# Patient Record
Sex: Female | Born: 1970 | Race: White | Hispanic: No | Marital: Married | Smoking: Never smoker
Health system: Southern US, Community
[De-identification: ages and names within clinical notes are randomized; demographics above are authoritative.]

## PROBLEM LIST (undated history)

## (undated) DIAGNOSIS — I499 Cardiac arrhythmia, unspecified: Secondary | ICD-10-CM

## (undated) DIAGNOSIS — R0683 Snoring: Secondary | ICD-10-CM

## (undated) DIAGNOSIS — I119 Hypertensive heart disease without heart failure: Secondary | ICD-10-CM

## (undated) DIAGNOSIS — R002 Palpitations: Secondary | ICD-10-CM

## (undated) HISTORY — DX: Cardiac arrhythmia, unspecified: I49.9

## (undated) HISTORY — DX: Snoring: R06.83

## (undated) HISTORY — DX: Palpitations: R00.2

## (undated) HISTORY — DX: Hypertensive heart disease without heart failure: I11.9

---

## 1998-08-29 ENCOUNTER — Inpatient Hospital Stay (HOSPITAL_COMMUNITY): Admission: RE | Admit: 1998-08-29 | Discharge: 1998-08-29 | Payer: Self-pay | Admitting: Obstetrics and Gynecology

## 1998-08-31 ENCOUNTER — Ambulatory Visit (HOSPITAL_COMMUNITY): Admission: RE | Admit: 1998-08-31 | Discharge: 1998-08-31 | Payer: Self-pay | Admitting: Obstetrics and Gynecology

## 1998-09-08 ENCOUNTER — Ambulatory Visit (HOSPITAL_COMMUNITY): Admission: RE | Admit: 1998-09-08 | Discharge: 1998-09-08 | Payer: Self-pay | Admitting: Obstetrics and Gynecology

## 1998-11-11 ENCOUNTER — Other Ambulatory Visit: Admission: RE | Admit: 1998-11-11 | Discharge: 1998-11-11 | Payer: Self-pay | Admitting: Obstetrics and Gynecology

## 1999-03-14 ENCOUNTER — Other Ambulatory Visit: Admission: RE | Admit: 1999-03-14 | Discharge: 1999-03-14 | Payer: Self-pay | Admitting: Obstetrics and Gynecology

## 1999-09-29 ENCOUNTER — Inpatient Hospital Stay (HOSPITAL_COMMUNITY): Admission: AD | Admit: 1999-09-29 | Discharge: 1999-09-29 | Payer: Self-pay | Admitting: Obstetrics & Gynecology

## 1999-10-06 ENCOUNTER — Inpatient Hospital Stay (HOSPITAL_COMMUNITY): Admission: AD | Admit: 1999-10-06 | Discharge: 1999-10-09 | Payer: Self-pay | Admitting: *Deleted

## 2000-07-16 ENCOUNTER — Other Ambulatory Visit: Admission: RE | Admit: 2000-07-16 | Discharge: 2000-07-16 | Payer: Self-pay | Admitting: Obstetrics and Gynecology

## 2001-11-10 ENCOUNTER — Other Ambulatory Visit: Admission: RE | Admit: 2001-11-10 | Discharge: 2001-11-10 | Payer: Self-pay | Admitting: Obstetrics and Gynecology

## 2002-06-19 ENCOUNTER — Ambulatory Visit (HOSPITAL_COMMUNITY): Admission: RE | Admit: 2002-06-19 | Discharge: 2002-06-19 | Payer: Self-pay | Admitting: Obstetrics and Gynecology

## 2002-09-02 ENCOUNTER — Inpatient Hospital Stay (HOSPITAL_COMMUNITY): Admission: AD | Admit: 2002-09-02 | Discharge: 2002-09-06 | Payer: Self-pay | Admitting: Obstetrics and Gynecology

## 2002-10-01 ENCOUNTER — Other Ambulatory Visit: Admission: RE | Admit: 2002-10-01 | Discharge: 2002-10-01 | Payer: Self-pay | Admitting: Obstetrics and Gynecology

## 2003-10-14 ENCOUNTER — Other Ambulatory Visit: Admission: RE | Admit: 2003-10-14 | Discharge: 2003-10-14 | Payer: Self-pay | Admitting: Obstetrics and Gynecology

## 2004-04-29 ENCOUNTER — Emergency Department (HOSPITAL_COMMUNITY): Admission: EM | Admit: 2004-04-29 | Discharge: 2004-04-29 | Payer: Self-pay | Admitting: Emergency Medicine

## 2012-01-11 ENCOUNTER — Ambulatory Visit: Admit: 2012-01-11 | Payer: Self-pay | Admitting: Obstetrics and Gynecology

## 2012-01-11 SURGERY — DILATATION & CURETTAGE/HYSTEROSCOPY WITH NOVASURE ABLATION
Anesthesia: Choice

## 2012-02-08 ENCOUNTER — Ambulatory Visit (INDEPENDENT_AMBULATORY_CARE_PROVIDER_SITE_OTHER): Payer: BC Managed Care – PPO | Admitting: Internal Medicine

## 2012-02-08 VITALS — BP 161/98 | HR 78 | Temp 97.9°F | Resp 16 | Ht 63.0 in | Wt 124.2 lb

## 2012-02-08 DIAGNOSIS — D649 Anemia, unspecified: Secondary | ICD-10-CM

## 2012-02-08 DIAGNOSIS — I10 Essential (primary) hypertension: Secondary | ICD-10-CM

## 2012-02-08 DIAGNOSIS — R51 Headache: Secondary | ICD-10-CM

## 2012-02-08 LAB — POCT URINALYSIS DIPSTICK
Bilirubin, UA: NEGATIVE
Blood, UA: NEGATIVE
Glucose, UA: NEGATIVE
Ketones, UA: NEGATIVE
Leukocytes, UA: NEGATIVE
Nitrite, UA: NEGATIVE
Protein, UA: NEGATIVE
Spec Grav, UA: 1.015
Urobilinogen, UA: 0.2
pH, UA: 7

## 2012-02-08 LAB — POCT CBC
Granulocyte percent: 64.4 %G (ref 37–80)
HCT, POC: 36.8 % — AB (ref 37.7–47.9)
Hemoglobin: 11.1 g/dL — AB (ref 12.2–16.2)
Lymph, poc: 1.4 (ref 0.6–3.4)
MCH, POC: 23.1 pg — AB (ref 27–31.2)
MCHC: 30.2 g/dL — AB (ref 31.8–35.4)
MCV: 76.6 fL — AB (ref 80–97)
MID (cbc): 0.4 (ref 0–0.9)
MPV: 10.6 fL (ref 0–99.8)
POC Granulocyte: 3.3 (ref 2–6.9)
POC LYMPH PERCENT: 27.9 %L (ref 10–50)
POC MID %: 7.7 %M (ref 0–12)
Platelet Count, POC: 283 10*3/uL (ref 142–424)
RBC: 4.8 M/uL (ref 4.04–5.48)
RDW, POC: 17.9 %
WBC: 5.1 10*3/uL (ref 4.6–10.2)

## 2012-02-08 LAB — POCT UA - MICROSCOPIC ONLY
Casts, Ur, LPF, POC: NEGATIVE
Crystals, Ur, HPF, POC: NEGATIVE
Mucus, UA: NEGATIVE
Yeast, UA: NEGATIVE

## 2012-02-08 MED ORDER — HYDROCHLOROTHIAZIDE 25 MG PO TABS: 25.0000 mg | ORAL_TABLET | Freq: Every day | ORAL | Status: DC

## 2012-02-08 NOTE — Progress Notes (Signed)
Subjective:    Patient ID: Jacqueline Roberts, female    DOB: 1971/07/30, 41 y.o.   MRN: [id number]  HPIheadache Onset several days ago  Throbbing right temple sometimes feels some numbness right side of the head no presently.  No radiation  Gaylyn Rong is moderate in severity under a lot of stress at work and is moving On and off for several days   has a hx of htn Was on a ace inhibitor caused cough and she stopped it 3 years ago Has a family history of htn    Review of Systems  Constitutional: Negative.   Eyes: Negative.   Respiratory: Negative.   Cardiovascular: Negative.   Gastrointestinal: Negative.   Genitourinary: Negative for vaginal pain.  Musculoskeletal: Negative.   Skin: Negative.   Neurological: Positive for headaches.  Hematological: Negative.   Psychiatric/Behavioral: Negative.   All other systems reviewed and are negative.       Objective:   Physical Exam  Nursing note and vitals reviewed. Constitutional: She is oriented to person, place, and time. She appears well-developed and well-nourished.  HENT:  Head: Normocephalic and atraumatic.  Left Ear: External ear normal.  Nose: Nose normal.  Mouth/Throat: Oropharynx is clear and moist.  Eyes: Conjunctivae and EOM are normal. Pupils are equal, round, and reactive to light.  Neck: Normal range of motion. Neck supple.  Cardiovascular: Normal rate, regular rhythm and normal heart sounds.   Pulmonary/Chest: Effort normal and breath sounds normal.  Abdominal: Soft.  Musculoskeletal: Normal range of motion.  Neurological: She is alert and oriented to person, place, and time. She has normal reflexes.  Skin: Skin is warm and dry.  Psychiatric: She has a normal mood and affect. Her behavior is normal. Judgment and thought content normal.      Results for orders placed in visit on 02/08/12  POCT CBC      Component Value Range   WBC 5.1  4.6 - 10.2 (K/uL)   Lymph, poc 1.4  0.6 - 3.4    POC LYMPH PERCENT 27.9  10 - 50  (%L)   MID (cbc) 0.4  0 - 0.9    POC MID % 7.7  0 - 12 (%M)   POC Granulocyte 3.3  2 - 6.9    Granulocyte percent 64.4  37 - 80 (%G)   RBC 4.80  4.04 - 5.48 (M/uL)   Hemoglobin 11.1 (*) 12.2 - 16.2 (g/dL)   HCT, POC 40.9 (*) 81.1 - 47.9 (%)   MCV 76.6 (*) 80 - 97 (fL)   MCH, POC 23.1 (*) 27 - 31.2 (pg)   MCHC 30.2 (*) 31.8 - 35.4 (g/dL)   RDW, POC 91.4     Platelet Count, POC 283  142 - 424 (K/uL)   MPV 10.6  0 - 99.8 (fL)  POCT UA - MICROSCOPIC ONLY      Component Value Range   WBC, Ur, HPF, POC 3-4     RBC, urine, microscopic 0-3     Bacteria, U Microscopic 2+     Mucus, UA neg     Epithelial cells, urine per micros 4-5     Crystals, Ur, HPF, POC neg     Casts, Ur, LPF, POC neg     Yeast, UA neg    POCT URINALYSIS DIPSTICK      Component Value Range   Color, UA yellow     Clarity, UA clear     Glucose, UA neg     Bilirubin, UA neg  Ketones, UA neg     Spec Grav, UA 1.015     Blood, UA neg     pH, UA 7.0     Protein, UA neg     Urobilinogen, UA 0.2     Nitrite, UA neg     Leukocytes, UA Negative       anemia mild pt is still menstruating. Also has some bacteria in urine but no leuks or nitirtes. Will increase hydration Assessment & Plan:  Has a hx of htn noncompliant with med's for the past several years. Has a family hx of htn and is under a lot of stress. Rechecked her bp nad it is elevated. bp is 160 /98.

## 2012-02-08 NOTE — Patient Instructions (Signed)
Take the bp medication daily. Control the salt in your diet. Exercise daily. follow up as directed.

## 2012-02-09 LAB — BASIC METABOLIC PANEL
BUN: 19 mg/dL (ref 6–23)
CO2: 26 mEq/L (ref 19–32)
Calcium: 9.2 mg/dL (ref 8.4–10.5)
Chloride: 104 mEq/L (ref 96–112)
Creat: 0.72 mg/dL (ref 0.50–1.10)
Glucose, Bld: 80 mg/dL (ref 70–99)
Potassium: 4.4 mEq/L (ref 3.5–5.3)
Sodium: 139 mEq/L (ref 135–145)

## 2013-05-07 ENCOUNTER — Other Ambulatory Visit: Payer: Self-pay | Admitting: Obstetrics and Gynecology

## 2014-08-20 ENCOUNTER — Other Ambulatory Visit: Payer: Self-pay | Admitting: Obstetrics and Gynecology

## 2014-08-24 LAB — CYTOLOGY - PAP

## 2014-12-23 ENCOUNTER — Other Ambulatory Visit: Payer: Self-pay | Admitting: Obstetrics and Gynecology

## 2015-10-18 ENCOUNTER — Ambulatory Visit (INDEPENDENT_AMBULATORY_CARE_PROVIDER_SITE_OTHER): Payer: BLUE CROSS/BLUE SHIELD

## 2015-10-18 ENCOUNTER — Ambulatory Visit (INDEPENDENT_AMBULATORY_CARE_PROVIDER_SITE_OTHER): Payer: BLUE CROSS/BLUE SHIELD | Admitting: Podiatry

## 2015-10-18 ENCOUNTER — Encounter: Payer: Self-pay | Admitting: Podiatry

## 2015-10-18 VITALS — BP 131/90 | HR 100 | Resp 16 | Ht 63.0 in | Wt 125.0 lb

## 2015-10-18 DIAGNOSIS — M898X9 Other specified disorders of bone, unspecified site: Secondary | ICD-10-CM | POA: Diagnosis not present

## 2015-10-18 DIAGNOSIS — M722 Plantar fascial fibromatosis: Secondary | ICD-10-CM | POA: Diagnosis not present

## 2015-10-18 NOTE — Progress Notes (Signed)
   Subjective:    Patient ID: Jacqueline Roberts, female    DOB: 03/30/1971, 45 y.o.   MRN: [id number]  HPI: She presents today with a chief complaint of a painful nodule to the plantar medial aspect of her left foot. She states that she noticed this nodule which initially was nontender November 2016. She states that over the past couple of months it has progressed in size and has become tender particularly in the mornings. She's done nothing for this.    Review of Systems  All other systems reviewed and are negative.      Objective:   Physical Exam: Vital signs are stable she is alert and oriented 3. No apparent distress. Pulses are strongly palpable. Neurologic sensorium is intact per Semmes-Weinstein monofilament. Deep tendon reflexes are intact bilateral and muscle strength +5 over 5 dorsiflexion and plantar flexors and inverters everters all intrinsic musculature is intact. Orthopedic evaluation of a straight solid joints distal to the ankle for range of motion without crepitus distal medial aspect of the medial band of plantar fascia does demonstrate small nonpulsatile firm nodule measuring less than 1-1/2 cm in total diameter. This nodule is tender on palpation. The contralateral foot does demonstrate a very early lesion which the patient did not even notice was present in a similar spot just proximal to the sesamoids. Radiographs do taken today do not demonstrate any type of osseus abnormalities in this area there is no calcification of the lesion. No underlying osseous pathology.        Assessment & Plan:  Plantar fibromatosis left greater than right.  Plan: I injected the lesion today with Kenalog and local anesthetic I will follow-up with her in 6 weeks. We discussed the pros and cons of this procedure etiology pathology conservative versus surgical therapies.

## 2015-11-29 ENCOUNTER — Encounter: Payer: Self-pay | Admitting: Podiatry

## 2015-11-29 ENCOUNTER — Ambulatory Visit (INDEPENDENT_AMBULATORY_CARE_PROVIDER_SITE_OTHER): Payer: BLUE CROSS/BLUE SHIELD | Admitting: Podiatry

## 2015-11-29 VITALS — BP 155/95 | HR 74 | Resp 16

## 2015-11-29 DIAGNOSIS — M722 Plantar fascial fibromatosis: Secondary | ICD-10-CM | POA: Diagnosis not present

## 2015-11-29 NOTE — Progress Notes (Signed)
She states that these nodules as started to go down. She states the one on the right is a little bit more tender than was the one on the left has decreased in size considerably.  Objective: Vital signs are stable she is alert and oriented 3 it appears that both of these lesions have decreased that we injected the left one only. There is no longer painful palpation or just touching the skin.  Assessment: Plantar fibromatosis left greater than right.  Plan: Follow-up with her in 1 month and consider reinjecting both of these.

## 2016-01-16 DIAGNOSIS — L821 Other seborrheic keratosis: Secondary | ICD-10-CM | POA: Diagnosis not present

## 2016-01-16 DIAGNOSIS — L739 Follicular disorder, unspecified: Secondary | ICD-10-CM | POA: Diagnosis not present

## 2016-01-16 DIAGNOSIS — L700 Acne vulgaris: Secondary | ICD-10-CM | POA: Diagnosis not present

## 2016-01-16 DIAGNOSIS — L720 Epidermal cyst: Secondary | ICD-10-CM | POA: Diagnosis not present

## 2016-03-13 DIAGNOSIS — R0683 Snoring: Secondary | ICD-10-CM | POA: Diagnosis not present

## 2016-03-13 DIAGNOSIS — J343 Hypertrophy of nasal turbinates: Secondary | ICD-10-CM | POA: Diagnosis not present

## 2016-03-13 DIAGNOSIS — H6521 Chronic serous otitis media, right ear: Secondary | ICD-10-CM | POA: Diagnosis not present

## 2016-03-13 DIAGNOSIS — J301 Allergic rhinitis due to pollen: Secondary | ICD-10-CM | POA: Diagnosis not present

## 2016-05-02 DIAGNOSIS — M25572 Pain in left ankle and joints of left foot: Secondary | ICD-10-CM | POA: Diagnosis not present

## 2016-10-12 DIAGNOSIS — H5213 Myopia, bilateral: Secondary | ICD-10-CM | POA: Diagnosis not present

## 2016-12-14 ENCOUNTER — Other Ambulatory Visit: Payer: Self-pay | Admitting: Obstetrics and Gynecology

## 2016-12-14 DIAGNOSIS — Z01419 Encounter for gynecological examination (general) (routine) without abnormal findings: Secondary | ICD-10-CM | POA: Diagnosis not present

## 2016-12-14 DIAGNOSIS — Z6823 Body mass index (BMI) 23.0-23.9, adult: Secondary | ICD-10-CM | POA: Diagnosis not present

## 2016-12-14 DIAGNOSIS — Z1231 Encounter for screening mammogram for malignant neoplasm of breast: Secondary | ICD-10-CM | POA: Diagnosis not present

## 2016-12-14 DIAGNOSIS — Z124 Encounter for screening for malignant neoplasm of cervix: Secondary | ICD-10-CM | POA: Diagnosis not present

## 2016-12-17 DIAGNOSIS — H168 Other keratitis: Secondary | ICD-10-CM | POA: Diagnosis not present

## 2016-12-17 LAB — CYTOLOGY - PAP

## 2017-01-15 DIAGNOSIS — L720 Epidermal cyst: Secondary | ICD-10-CM | POA: Diagnosis not present

## 2017-01-15 DIAGNOSIS — D2239 Melanocytic nevi of other parts of face: Secondary | ICD-10-CM | POA: Diagnosis not present

## 2017-03-12 ENCOUNTER — Ambulatory Visit
Admission: RE | Admit: 2017-03-12 | Discharge: 2017-03-12 | Disposition: A | Discharge status: Home / Self Care | Payer: BLUE CROSS/BLUE SHIELD | Source: Ambulatory Visit | Attending: Cardiology | Admitting: Cardiology

## 2017-03-12 ENCOUNTER — Other Ambulatory Visit: Payer: Self-pay | Admitting: Cardiology

## 2017-03-12 DIAGNOSIS — I10 Essential (primary) hypertension: Secondary | ICD-10-CM

## 2017-03-15 DIAGNOSIS — I119 Hypertensive heart disease without heart failure: Secondary | ICD-10-CM | POA: Diagnosis not present

## 2017-03-15 DIAGNOSIS — R0683 Snoring: Secondary | ICD-10-CM | POA: Diagnosis not present

## 2017-04-08 ENCOUNTER — Other Ambulatory Visit (HOSPITAL_COMMUNITY): Payer: Self-pay | Admitting: Obstetrics and Gynecology

## 2017-04-08 DIAGNOSIS — I119 Hypertensive heart disease without heart failure: Secondary | ICD-10-CM

## 2017-04-09 ENCOUNTER — Ambulatory Visit (HOSPITAL_COMMUNITY)
Admission: RE | Admit: 2017-04-09 | Discharge: 2017-04-09 | Disposition: A | Discharge status: Home / Self Care | Payer: BLUE CROSS/BLUE SHIELD | Source: Ambulatory Visit | Attending: Vascular Surgery | Admitting: Vascular Surgery

## 2017-04-09 DIAGNOSIS — I119 Hypertensive heart disease without heart failure: Secondary | ICD-10-CM | POA: Diagnosis not present

## 2017-04-10 DIAGNOSIS — Z6822 Body mass index (BMI) 22.0-22.9, adult: Secondary | ICD-10-CM | POA: Diagnosis not present

## 2017-04-10 DIAGNOSIS — R87610 Atypical squamous cells of undetermined significance on cytologic smear of cervix (ASC-US): Secondary | ICD-10-CM | POA: Diagnosis not present

## 2017-04-15 DIAGNOSIS — I119 Hypertensive heart disease without heart failure: Secondary | ICD-10-CM | POA: Diagnosis not present

## 2017-04-23 ENCOUNTER — Ambulatory Visit (HOSPITAL_BASED_OUTPATIENT_CLINIC_OR_DEPARTMENT_OTHER): Payer: BLUE CROSS/BLUE SHIELD | Attending: Cardiology | Admitting: Internal Medicine

## 2017-04-23 VITALS — Ht 63.0 in | Wt 128.0 lb

## 2017-04-23 DIAGNOSIS — R0683 Snoring: Secondary | ICD-10-CM

## 2017-04-23 DIAGNOSIS — G4733 Obstructive sleep apnea (adult) (pediatric): Secondary | ICD-10-CM | POA: Diagnosis not present

## 2017-04-27 DIAGNOSIS — R0683 Snoring: Secondary | ICD-10-CM

## 2017-04-27 NOTE — Procedures (Signed)
   Patient Name: Jacqueline Roberts, Jacqueline Roberts Date: 04/23/2017 Gender: Female D.O.B: 04/15/1971 Age (years): 46 Referring Provider: Ellwood Handler Height (inches): 63 Interpreting Physician: Jetty Duhamel MD, ABSM Weight (lbs): 128 RPSGT: Tennyson Sink BMI: 23 MRN: [id number] Neck Size: 12.00 CLINICAL INFORMATION Sleep Study Type: unattended HST  Indication for sleep study: Snoring  Epworth Sleepiness Score: 1  SLEEP STUDY TECHNIQUE A multi-channel overnight portable sleep study was performed. The channels recorded were: nasal airflow, thoracic respiratory movement, and oxygen saturation with a pulse oximetry. Snoring was also monitored.  MEDICATIONS Patient self administered medications include: none reported.  SLEEP ARCHITECTURE Patient was studied for 490.0 minutes. The sleep efficiency was 100.0 % and the patient was supine for 62.3%. The arousal index was 0.0 per hour.  RESPIRATORY PARAMETERS The overall AHI was 10.4 per hour, with a central apnea index of 0.0 per hour.  The oxygen nadir was 78% during sleep.  CARDIAC DATA Mean heart rate during sleep was 63.5 bpm.  IMPRESSIONS - Mild obstructive sleep apnea occurred during this study (AHI = 10.4/h). - No significant central sleep apnea occurred during this study (CAI = 0.0/h). - Severe oxygen desaturation was noted during this study (Min O2 = 78%, Mean 95%). - Patient snored.  DIAGNOSIS - Obstructive Sleep Apnea (327.23 [G47.33 ICD-10])  RECOMMENDATIONS  - Consider CPAP or a fitted oral appliance for scores in this range. Other options, beyond weight control and sleep off back, will be based on clinical judgment. - Be careful with alcohol, sedatives and other CNS depressants that may worsen sleep apnea and disrupt normal sleep architecture. - Sleep hygiene should be reviewed to assess factors that may improve sleep quality. - Weight management and regular exercise should be initiated or  continued.  [Electronically signed] 04/27/2017 10:00 AM  Jetty Duhamel MD, ABSM Diplomate, American Board of Sleep Medicine   NPI: [id number]  Waymon Budge Diplomate, American Board of Sleep Medicine  ELECTRONICALLY SIGNED ON:  04/27/2017, 9:56 AM [facility] PH: [phone]   FX: [phone] ACCREDITED BY THE AMERICAN ACADEMY OF SLEEP MEDICINE

## 2017-05-14 ENCOUNTER — Encounter: Payer: Self-pay | Admitting: Internal Medicine

## 2017-05-14 ENCOUNTER — Ambulatory Visit (INDEPENDENT_AMBULATORY_CARE_PROVIDER_SITE_OTHER): Payer: BLUE CROSS/BLUE SHIELD | Admitting: Internal Medicine

## 2017-05-14 VITALS — BP 122/78 | HR 57 | Ht 63.0 in | Wt 130.6 lb

## 2017-05-14 DIAGNOSIS — J3089 Other allergic rhinitis: Secondary | ICD-10-CM

## 2017-05-14 DIAGNOSIS — I10 Essential (primary) hypertension: Secondary | ICD-10-CM | POA: Insufficient documentation

## 2017-05-14 DIAGNOSIS — J302 Other seasonal allergic rhinitis: Secondary | ICD-10-CM | POA: Diagnosis not present

## 2017-05-14 DIAGNOSIS — G4733 Obstructive sleep apnea (adult) (pediatric): Secondary | ICD-10-CM

## 2017-05-14 DIAGNOSIS — R0683 Snoring: Secondary | ICD-10-CM

## 2017-05-14 HISTORY — DX: Other seasonal allergic rhinitis: J30.2

## 2017-05-14 HISTORY — DX: Other allergic rhinitis: J30.89

## 2017-05-14 HISTORY — DX: Obstructive sleep apnea (adult) (pediatric): G47.33

## 2017-05-14 HISTORY — DX: Essential (primary) hypertension: I10

## 2017-05-14 NOTE — Assessment & Plan Note (Signed)
Managed by cardiology 

## 2017-05-14 NOTE — Assessment & Plan Note (Signed)
Treatment for mild obstructive sleep apnea is targeting mainly disturbing symptoms. She doesn't recognize daytime sleepiness but might in retrospect if sleep apnea is controlled. We discussed benefit of keeping weight down and keeping seasonal nasal congestion controlled. We discussed treatment options and I think she would be a good candidate to consider a fitted oral appliance first. CPAP may be more intrusive. Plan-referral to orthodontist to consider oral appliance for treatment of OSA

## 2017-05-14 NOTE — Progress Notes (Signed)
05/14/17-46 year old female never smoker for sleep evaluation. Referred courtesy of Dr Donnie Aho; had sleep study and attached. No CPAP machine. Home sleep test 04/23/17 AHI 10.4/ hour, desaturation to 78% with mean saturation 95%, body weight 128 pounds She is followed by Dr. Donnie Aho for hypertension and reports mild seasonal allergic rhinitis with variable nasal stuffiness managed OTC. No ENT surgery, lung or heart problems, or thyroid disease identified. Sleep study was done because of loud chronic snoring for which her husband pokes her to roll over, with comorbidities as noted. She doesn't recognize much daytime sleepiness and endorses Epworth score 1/24. One cup of coffee. She had tried OTC nasal strips and nostril spacer without benefit. Bedtime between 10 and 11 PM, 15 minutes sleep latency, waking at least once and sometimes several times per night before up around 6:30 AM. Works regular hours in [employer]. One or 2 glasses of wine 5 days per week.  Prior to Admission medications   Medication Sig Start Date End Date Taking? Authorizing Provider  amLODipine (NORVASC) 5 MG tablet Take 1 tablet by mouth daily.   Yes [provider]  hydrochlorothiazide (MICROZIDE) 12.5 MG capsule Take 1 capsule by mouth daily. 04/08/17  Yes [provider]  metoprolol succinate (TOPROL-XL) 100 MG 24 hr tablet Take 1 tablet by mouth daily. 04/26/17  Yes [provider]  Multiple Vitamin (MULTIVITAMIN) tablet Take 1 tablet by mouth daily.   Yes [provider]   Past Medical History:  Diagnosis Date  . 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 Snoring    No past surgical history on file. Family History  Problem Relation Age of Onset  . Hypothyroidism Mother   . Hypertension Father   . Healthy Brother   . Healthy Brother    Social History   Social History  . Marital status: Married    Spouse name: N/A  . Number of children: N/A  . Years of education:  N/A   Occupational History  . customer servie     [employer]   Social History Main Topics  . Smoking status: Never Smoker  . Smokeless tobacco: Never Used  . Alcohol use 0.6 oz/week    1 Glasses of wine per week     Comment: Red  . Drug use: No  . Sexual activity: Not on file   Other Topics Concern  . Not on file   Social History Narrative  . No narrative on file   ROS-see HPI   + = Positive Constitutional:    weight loss, night sweats, fevers, chills, fatigue, lassitude. HEENT:    headaches, difficulty swallowing, tooth/dental problems, sore throat,       sneezing, itching, ear ache, +nasal congestion, post nasal drip, snoring CV:    chest pain, orthopnea, PND, swelling in lower extremities, anasarca,                                                        dizziness, palpitations Resp:   shortness of breath with exertion or at rest.                productive cough,   non-productive cough, coughing up of blood.              change in color of mucus.  wheezing.  Skin:   + rash or lesions. GI:   + heartburn, indigestion, abdominal pain, nausea, vomiting, diarrhea,                 change in bowel habits, loss of appetite GU: dysuria, change in color of urine, no urgency or frequency.   flank pain. MS:  + joint pain, stiffness, decreased range of motion, back pain. Neuro-     nothing unusual Psych:  change in mood or affect.  depression or anxiety.   memory loss.  OBJ- Physical Exam General- Alert, Oriented, Affect-appropriate, Distress- none acute, medium build Skin- rash-none, lesions- none, excoriation- none Lymphadenopathy- none Head- atraumatic            Eyes- Gross vision intact, PERRLA, conjunctivae and secretions clear            Ears- Hearing, canals-normal            Nose- + minimal turbinate edema, no-Septal dev, mucus, polyps, erosion, perforation             Throat- Mallampati III , mucosa clear , drainage- none, tonsils- atrophic Neck- flexible ,  trachea midline, no stridor , thyroid nl, carotid no bruit Chest - symmetrical excursion , unlabored           Heart/CV- RRR , no murmur , no gallop  , no rub, nl s1 s2                           - JVD- none , edema- none, stasis changes- none, varices- none           Lung- clear to P&A, wheeze- none, cough- none , dullness-none, rub- none           Chest wall-  Abd-  Br/ Gen/ Rectal- Not done, not indicated Extrem- cyanosis- none, clubbing, none, atrophy- none, strength- nl Neuro- grossly intact to observation

## 2017-05-14 NOTE — Patient Instructions (Signed)
Order- referral to orthodontist Dr Jacqueline GrimmerMark Roberts    Consider oral appliance for OSA and snoring  Please call if we can help

## 2017-05-14 NOTE — Assessment & Plan Note (Signed)
We discussed availability of OTC nonsedating antihistamines like Claritin, and nasal steroid like Flonase, seasonally if needed.

## 2017-05-31 DIAGNOSIS — G4733 Obstructive sleep apnea (adult) (pediatric): Secondary | ICD-10-CM | POA: Diagnosis not present

## 2017-05-31 DIAGNOSIS — I119 Hypertensive heart disease without heart failure: Secondary | ICD-10-CM | POA: Diagnosis not present

## 2017-06-05 DIAGNOSIS — D1801 Hemangioma of skin and subcutaneous tissue: Secondary | ICD-10-CM | POA: Diagnosis not present

## 2017-06-05 DIAGNOSIS — L720 Epidermal cyst: Secondary | ICD-10-CM | POA: Diagnosis not present

## 2017-06-24 DIAGNOSIS — I10 Essential (primary) hypertension: Secondary | ICD-10-CM | POA: Diagnosis not present

## 2017-07-12 DIAGNOSIS — G4733 Obstructive sleep apnea (adult) (pediatric): Secondary | ICD-10-CM | POA: Diagnosis not present

## 2017-07-12 DIAGNOSIS — I119 Hypertensive heart disease without heart failure: Secondary | ICD-10-CM | POA: Diagnosis not present

## 2017-07-31 DIAGNOSIS — J342 Deviated nasal septum: Secondary | ICD-10-CM | POA: Diagnosis not present

## 2017-07-31 DIAGNOSIS — J310 Chronic rhinitis: Secondary | ICD-10-CM | POA: Diagnosis not present

## 2017-07-31 DIAGNOSIS — J343 Hypertrophy of nasal turbinates: Secondary | ICD-10-CM | POA: Diagnosis not present

## 2017-07-31 DIAGNOSIS — G4733 Obstructive sleep apnea (adult) (pediatric): Secondary | ICD-10-CM | POA: Diagnosis not present

## 2017-08-29 ENCOUNTER — Institutional Professional Consult (permissible substitution): Payer: BLUE CROSS/BLUE SHIELD | Admitting: Internal Medicine

## 2017-09-12 DIAGNOSIS — L309 Dermatitis, unspecified: Secondary | ICD-10-CM | POA: Diagnosis not present

## 2017-10-18 DIAGNOSIS — I119 Hypertensive heart disease without heart failure: Secondary | ICD-10-CM | POA: Diagnosis not present

## 2017-10-18 DIAGNOSIS — G4733 Obstructive sleep apnea (adult) (pediatric): Secondary | ICD-10-CM | POA: Diagnosis not present

## 2017-11-13 ENCOUNTER — Encounter: Payer: Self-pay | Admitting: Podiatry

## 2017-12-03 ENCOUNTER — Ambulatory Visit: Payer: Self-pay | Admitting: General Surgery

## 2017-12-03 DIAGNOSIS — K625 Hemorrhage of anus and rectum: Secondary | ICD-10-CM | POA: Diagnosis not present

## 2017-12-03 NOTE — H&P (Signed)
History of Present Illness Romie Levee MD; 12/03/2017 9:32 AM) The patient is a 47 year old female who presents with a complaint of Rectal bleeding. 47 year old female who presents to the office with complaints of rectal bleeding and pain with bowel movements. She states that over the past 3-4 months she has had worsening pain which occurs 3-4 times a week with bowel movements. She notices blood on the toilet paper and sometimes in her stool on a daily basis. She occasionally has to strain to have a bowel movement. She does have regular function. She denies a family history of colon cancer. She has never had a colonoscopy.   Past Surgical History Doristine Devoid, CMA; 12/03/2017 9:28 AM) No pertinent past surgical history  Diagnostic Studies History Doristine Devoid, CMA; 12/03/2017 9:28 AM) Colonoscopy never Mammogram within last year Pap Smear 1-5 years ago  Allergies Doristine Devoid, CMA; 12/03/2017 9:29 AM) No Known Drug Allergies [12/03/2017]:  Medication History (Doristine Devoid, CMA; 12/03/2017 9:29 AM) AmLODIPine Besylate (5MG  Tablet, Oral) Active. HydroCHLOROthiazide (12.5MG  Capsule, Oral) Active. Metoprolol Succinate ER (100MG  Tablet ER 24HR, Oral) Active. Multivitamin Adult (Oral) Active. Medications Reconciled  Social History Doristine Devoid, CMA; 12/03/2017 9:28 AM) Alcohol use Occasional alcohol use. Caffeine use Coffee. No drug use Tobacco use Never smoker.  Family History Doristine Devoid, CMA; 12/03/2017 9:28 AM) Hypertension Father. Rectal Cancer Father.  Pregnancy / Birth History Doristine Devoid, CMA; 12/03/2017 9:28 AM) Age at menarche 14 years. Gravida 3 Maternal age 15-30 Para 2  Other Problems Doristine Devoid, CMA; 12/03/2017 9:28 AM) Gastroesophageal Reflux Disease Hemorrhoids High blood pressure Sleep Apnea     Review of Systems (Chemira Jones CMA; 12/03/2017 9:28 AM) General Not Present- Appetite Loss, Chills, Fatigue, Fever, Night Sweats,  Weight Gain and Weight Loss. Skin Not Present- Change in Wart/Mole, Dryness, Hives, Jaundice, New Lesions, Non-Healing Wounds, Rash and Ulcer. HEENT Present- Wears glasses/contact lenses. Not Present- Earache, Hearing Loss, Hoarseness, Nose Bleed, Oral Ulcers, Ringing in the Ears, Seasonal Allergies, Sinus Pain, Sore Throat, Visual Disturbances and Yellow Eyes. Respiratory Present- Snoring. Not Present- Bloody sputum, Chronic Cough, Difficulty Breathing and Wheezing. Breast Not Present- Breast Mass, Breast Pain, Nipple Discharge and Skin Changes. Cardiovascular Not Present- Chest Pain, Difficulty Breathing Lying Down, Leg Cramps, Palpitations, Rapid Heart Rate, Shortness of Breath and Swelling of Extremities. Gastrointestinal Present- Hemorrhoids. Not Present- Abdominal Pain, Bloating, Bloody Stool, Change in Bowel Habits, Chronic diarrhea, Constipation, Difficulty Swallowing, Excessive gas, Gets full quickly at meals, Indigestion, Nausea, Rectal Pain and Vomiting. Female Genitourinary Not Present- Frequency, Nocturia, Painful Urination, Pelvic Pain and Urgency. Musculoskeletal Not Present- Back Pain, Joint Pain, Joint Stiffness, Muscle Pain, Muscle Weakness and Swelling of Extremities. Neurological Not Present- Decreased Memory, Fainting, Headaches, Numbness, Seizures, Tingling, Tremor, Trouble walking and Weakness. Psychiatric Not Present- Anxiety, Bipolar, Change in Sleep Pattern, Depression, Fearful and Frequent crying. Endocrine Not Present- Cold Intolerance, Excessive Hunger, Hair Changes, Heat Intolerance, Hot flashes and New Diabetes. Hematology Not Present- Blood Thinners, Easy Bruising, Excessive bleeding, Gland problems, HIV and Persistent Infections.  Vitals (Chemira Jones CMA; 12/03/2017 9:28 AM) 12/03/2017 9:28 AM Weight: 136.8 lb Height: 63in Body Surface Area: 1.65 m Body Mass Index: 24.23 kg/m  Temp.: 97.31F(Oral)  Pulse: 86 (Regular)  BP: 124/82 (Sitting, Left Arm,  Standard)      Physical Exam Romie Levee MD; 12/03/2017 9:40 AM)  General Mental Status-Alert. General Appearance-Not in acute distress. Build & Nutrition-Well nourished. Posture-Normal posture. Gait-Normal.  Head and Neck Head-normocephalic, atraumatic with no lesions or palpable masses. Trachea-midline.  Chest and Lung Exam Chest and lung exam reveals -on auscultation, normal breath sounds, no adventitious sounds and normal vocal resonance.  Cardiovascular Cardiovascular examination reveals -normal heart sounds, regular rate and rhythm with no murmurs and no digital clubbing, cyanosis, edema, increased warmth or tenderness.  Abdomen Inspection Inspection of the abdomen reveals - No Hernias. Palpation/Percussion Palpation and Percussion of the abdomen reveal - Soft, Non Tender, No Rigidity (guarding), No hepatosplenomegaly and No Palpable abdominal masses.  Neurologic Neurologic evaluation reveals -alert and oriented x 3 with no impairment of recent or remote memory, normal attention span and ability to concentrate, normal sensation and normal coordination.  Musculoskeletal Normal Exam - Bilateral-Upper Extremity Strength Normal and Lower Extremity Strength Normal.   Results Romie Levee(Emelie Newsom MD; 12/03/2017 9:47 AM) Procedures  Name Value Date ANOSCOPY, DIAGNOSTIC ([id number]) [ Hemorrhoids ] Procedure Other: Procedure: Anoscopy....Marland Kitchen.Marland Kitchen.Surgeon: Maisie Fushomas....Marland Kitchen.Marland Kitchen.After the risks and benefits were explained, verbal consent was obtained for above procedure. A medical assistant chaperone was present thoroughout the entire procedure. ....Marland Kitchen.Marland Kitchen.Anesthesia: none....Marland Kitchen.Marland Kitchen.Diagnosis: rectal bleeding....Marland Kitchen.Marland Kitchen.Findings: grade 1 LL and RA hemorrhoids, grade 2 RP hemorrhoid with associated external component  Performed: 12/03/2017 9:45 AM    Assessment & Plan Romie Levee(Corene Resnick MD; 12/03/2017 9:45 AM)  RECTAL BLEEDING (K62.5) Impression: Residual female with rectal bleeding  and anal pain. This is most likely due to a right posterior hemorrhoid. Given her age and symptoms, I would like to get a colonoscopy to rule out colorectal cancer first. If this is negative, we will discuss possible hemorrhoidectomy. In the meantime I will give her a suppository and I have asked her to start a fiber supplement to help control her symptoms.

## 2017-12-06 DIAGNOSIS — D692 Other nonthrombocytopenic purpura: Secondary | ICD-10-CM | POA: Diagnosis not present

## 2017-12-06 DIAGNOSIS — B078 Other viral warts: Secondary | ICD-10-CM | POA: Diagnosis not present

## 2017-12-06 DIAGNOSIS — D2261 Melanocytic nevi of right upper limb, including shoulder: Secondary | ICD-10-CM | POA: Diagnosis not present

## 2017-12-06 DIAGNOSIS — D2262 Melanocytic nevi of left upper limb, including shoulder: Secondary | ICD-10-CM | POA: Diagnosis not present

## 2017-12-06 DIAGNOSIS — L570 Actinic keratosis: Secondary | ICD-10-CM | POA: Diagnosis not present

## 2017-12-06 DIAGNOSIS — D225 Melanocytic nevi of trunk: Secondary | ICD-10-CM | POA: Diagnosis not present

## 2018-01-08 ENCOUNTER — Ambulatory Visit (HOSPITAL_COMMUNITY)
Admission: RE | Admit: 2018-01-08 | Discharge: 2018-01-08 | Disposition: A | Discharge status: Home / Self Care | Payer: BLUE CROSS/BLUE SHIELD | Source: Ambulatory Visit | Attending: General Surgery | Admitting: General Surgery

## 2018-01-08 ENCOUNTER — Encounter (HOSPITAL_COMMUNITY)
Admission: RE | Disposition: A | Discharge status: Home / Self Care | Payer: Self-pay | Source: Ambulatory Visit | Attending: General Surgery

## 2018-01-08 ENCOUNTER — Encounter (HOSPITAL_COMMUNITY): Payer: Self-pay | Admitting: *Deleted

## 2018-01-08 DIAGNOSIS — K625 Hemorrhage of anus and rectum: Secondary | ICD-10-CM | POA: Insufficient documentation

## 2018-01-08 DIAGNOSIS — I10 Essential (primary) hypertension: Secondary | ICD-10-CM | POA: Insufficient documentation

## 2018-01-08 DIAGNOSIS — Z1211 Encounter for screening for malignant neoplasm of colon: Secondary | ICD-10-CM | POA: Diagnosis not present

## 2018-01-08 DIAGNOSIS — K219 Gastro-esophageal reflux disease without esophagitis: Secondary | ICD-10-CM | POA: Insufficient documentation

## 2018-01-08 DIAGNOSIS — Z79899 Other long term (current) drug therapy: Secondary | ICD-10-CM | POA: Insufficient documentation

## 2018-01-08 DIAGNOSIS — G4730 Sleep apnea, unspecified: Secondary | ICD-10-CM | POA: Insufficient documentation

## 2018-01-08 HISTORY — PX: COLONOSCOPY: SHX5424

## 2018-01-08 SURGERY — COLONOSCOPY
Anesthesia: Moderate Sedation

## 2018-01-08 MED ORDER — FENTANYL CITRATE (PF) 100 MCG/2ML IJ SOLN
INTRAMUSCULAR | Status: AC
  Filled 2018-01-08: qty 2

## 2018-01-08 MED ORDER — DIPHENHYDRAMINE HCL 50 MG/ML IJ SOLN
INTRAMUSCULAR | Status: AC
  Filled 2018-01-08: qty 1

## 2018-01-08 MED ORDER — FENTANYL CITRATE (PF) 100 MCG/2ML IJ SOLN
INTRAMUSCULAR | Status: DC | PRN
  Administered 2018-01-08 (×5): 25 ug via INTRAVENOUS

## 2018-01-08 MED ORDER — LACTATED RINGERS IV SOLN: INTRAVENOUS | Status: DC

## 2018-01-08 MED ORDER — MIDAZOLAM HCL 5 MG/ML IJ SOLN
INTRAMUSCULAR | Status: AC
  Filled 2018-01-08: qty 2

## 2018-01-08 MED ORDER — MIDAZOLAM HCL 10 MG/2ML IJ SOLN
INTRAMUSCULAR | Status: DC | PRN
  Administered 2018-01-08 (×2): 2 mg via INTRAVENOUS
  Administered 2018-01-08 (×2): 1 mg via INTRAVENOUS
  Administered 2018-01-08: 2 mg via INTRAVENOUS

## 2018-01-08 MED ORDER — SODIUM CHLORIDE 0.9 % IV SOLN
INTRAVENOUS | Status: AC | PRN
  Administered 2018-01-08: 500 mL via INTRAMUSCULAR

## 2018-01-08 NOTE — H&P (Signed)
History of Present Illness Romie Levee MD; 12/03/2017 9:32 AM) The patient is a 47 year old female who presents with a complaint of Rectal bleeding. 47 year old female who presents to the office with complaints of rectal bleeding and pain with bowel movements. She states that over the past 3-4 months she has had worsening pain which occurs 3-4 times a week with bowel movements. She notices blood on the toilet paper and sometimes in her stool on a daily basis. She occasionally has to strain to have a bowel movement. She does have regular function. She denies a family history of colon cancer. She has never had a colonoscopy.   Past Surgical History Doristine Devoid, CMA; 12/03/2017 9:28 AM) No pertinent past surgical history   Diagnostic Studies History Doristine Devoid, CMA; 12/03/2017 9:28 AM) Colonoscopy  never Mammogram  within last year Pap Smear  1-5 years ago  Allergies Doristine Devoid, CMA; 12/03/2017 9:29 AM) No Known Drug Allergies [12/03/2017]:  Medication History (Doristine Devoid, CMA; 12/03/2017 9:29 AM) AmLODIPine Besylate (  Tablet, Oral) Active. HydroCHLOROthiazide (12.5MG  Capsule, Oral) Active. Metoprolol Succinate ER (  Tablet ER 24HR, Oral) Active. Multivitamin Adult (Oral) Active. Medications Reconciled  Social History Doristine Devoid, CMA; 12/03/2017 9:28 AM) Alcohol use  Occasional alcohol use. Caffeine use  Coffee. No drug use  Tobacco use  Never smoker.  Family History Doristine Devoid, CMA; 12/03/2017 9:28 AM) Hypertension  Father. Rectal Cancer  Father.  Pregnancy / Birth History Doristine Devoid, CMA; 12/03/2017 9:28 AM) Age at menarche  14 years. Gravida  3 Maternal age  36-30 Para  2  Other Problems Doristine Devoid, CMA; 12/03/2017 9:28 AM) Gastroesophageal Reflux Disease  Hemorrhoids  High blood pressure  Sleep Apnea     Review of Systems  General Not Present- Appetite Loss, Chills, Fatigue, Fever, Night Sweats, Weight Gain and  Weight Loss. Skin Not Present- Change in Wart/Mole, Dryness, Hives, Jaundice, New Lesions, Non-Healing Wounds, Rash and Ulcer. HEENT Present- Wears glasses/contact lenses. Not Present- Earache, Hearing Loss, Hoarseness, Nose Bleed, Oral Ulcers, Ringing in the Ears, Seasonal Allergies, Sinus Pain, Sore Throat, Visual Disturbances and Yellow Eyes. Respiratory Present- Snoring. Not Present- Bloody sputum, Chronic Cough, Difficulty Breathing and Wheezing. Breast Not Present- Breast Mass, Breast Pain, Nipple Discharge and Skin Changes. Cardiovascular Not Present- Chest Pain, Difficulty Breathing Lying Down, Leg Cramps, Palpitations, Rapid Heart Rate, Shortness of Breath and Swelling of Extremities. Gastrointestinal Present- Hemorrhoids. Not Present- Abdominal Pain, Bloating, Bloody Stool, Change in Bowel Habits, Chronic diarrhea, Constipation, Difficulty Swallowing, Excessive gas, Gets full quickly at meals, Indigestion, Nausea, Rectal Pain and Vomiting. Female Genitourinary Not Present- Frequency, Nocturia, Painful Urination, Pelvic Pain and Urgency. Musculoskeletal Not Present- Back Pain, Joint Pain, Joint Stiffness, Muscle Pain, Muscle Weakness and Swelling of Extremities. Neurological Not Present- Decreased Memory, Fainting, Headaches, Numbness, Seizures, Tingling, Tremor, Trouble walking and Weakness. Psychiatric Not Present- Anxiety, Bipolar, Change in Sleep Pattern, Depression, Fearful and Frequent crying. Endocrine Not Present- Cold Intolerance, Excessive Hunger, Hair Changes, Heat Intolerance, Hot flashes and New Diabetes. Hematology Not Present- Blood Thinners, Easy Bruising, Excessive bleeding, Gland problems, HIV and Persistent Infections.  BP (!) 143/94   Pulse 86   Temp 98 F (36.7 C) (Oral)   Resp 12   Ht  (1.6 m)   Wt 59 kg (130 lb)   SpO2 100%   BMI 23.03 kg/m    Physical Exam  General Mental Status-Alert. General Appearance-Not in acute distress. Build &  Nutrition-Well nourished. Posture-Normal posture. Gait-Normal.  Head and Neck  Head-normocephalic, atraumatic with no lesions or palpable masses. Trachea-midline.  Chest and Lung Exam Chest and lung exam reveals -on auscultation, normal breath sounds, no adventitious sounds and normal vocal resonance.  Cardiovascular Cardiovascular examination reveals -normal heart sounds, regular rate and rhythm with no murmurs and no digital clubbing, cyanosis, edema, increased warmth or tenderness.  Abdomen Inspection Inspection of the abdomen reveals - No Hernias. Palpation/Percussion Palpation and Percussion of the abdomen reveal - Soft, Non Tender, No Rigidity (guarding), No hepatosplenomegaly and No Palpable abdominal masses.  Neurologic Neurologic evaluation reveals -alert and oriented x 3 with no impairment of recent or remote memory, normal attention span and ability to concentrate, normal sensation and normal coordination.  Musculoskeletal Normal Exam - Bilateral-Upper Extremity Strength Normal and Lower Extremity Strength Normal.   ANOSCOPY, DIAGNOSTIC (46600) [ Hemorrhoids ] Procedure Other: Procedure: Anoscopy....Marland KitchenMarland KitchenSurgeon: Maisie Fus....Marland KitchenMarland Kitch([id number] the risks and benefits were explained, verbal consent was obtained for above procedure. A medical assistant chaperone was present thoroughout the entire procedure. ....Marland KitchenMarland KitchenAnesthesia: none....Marland KitchenMarland KitchenDiagnosis: rectal bleeding....Marland KitchenMarland KitchenFindings: grade 1 LL and RA hemorrhoids, grade 2 RP hemorrhoid with associated external component  Performed: 12/03/2017 9:45 AM    Assessment & Plan RECTAL BLEEDING (K62.5) Impression: Residual female with rectal bleeding and anal pain. This is most likely due to a right posterior hemorrhoid. Given her age and symptoms, I would like to get a colonoscopy to rule out colorectal cancer first. We discussed the risks of procedure, which include bleeding, perforation and missed pathology.  All questions  were answered

## 2018-01-08 NOTE — Op Note (Signed)
[facility] Patient Name: Jacqueline Roberts Procedure Date: 01/08/2018 MRN: [id number] Attending MD: Romie Levee , MD Date of Birth: 1971/06/28 CSN: [id number] Age: 47 Admit Type: Outpatient Procedure:                Colonoscopy Indications:              Screening for colorectal malignant neoplasm Providers:                Romie Levee, MD, Dwain Sarna, RN, Madalyn Rob, Technician, Leroy Libman, CRNA Referring MD:              Medicines:                Fentanyl 125 micrograms IV, Midazolam 10 mg IV Complications:            No immediate complications. Estimated Blood Loss:     Estimated blood loss: none. Procedure:                Pre-Anesthesia Assessment:                           - Prior to the procedure, a History and Physical                            was performed, and patient medications and                            allergies were reviewed. The patient's tolerance of                            previous anesthesia was also reviewed. The risks                            and benefits of the procedure and the sedation                            options and risks were discussed with the patient.                            All questions were answered, and informed consent                            was obtained. Prior Anticoagulants: The patient has                            taken no previous anticoagulant or antiplatelet                            agents. ASA Grade Assessment: II - A patient with                            mild systemic disease. After reviewing the risks  and benefits, the patient was deemed in                            satisfactory condition to undergo the procedure.                           After obtaining informed consent, the colonoscope                            was passed under direct vision. Throughout the                            procedure, the patient's blood pressure,  pulse, and                            oxygen saturations were monitored continuously. The                            was introduced through the anus and advanced to the                            the cecum, identified by the ileocecal valve. The                            colonoscopy was somewhat difficult due to a                            redundant colon. Successful completion of the                            procedure was aided by increasing the dose of                            sedation medication, changing the patient to a                            supine position, using manual pressure and using                            scope torsion. The patient tolerated the procedure                            fairly well. The quality of the bowel preparation                            was excellent. The ileocecal valve and the rectum                            were photographed. Scope withdrawal time was 11                            minutes. Scope In: 11:53:23 AM Scope Out: 12:28:43 PM Scope Withdrawal Time: 0 hours 10 minutes 40 seconds  Total  Procedure Duration: 0 hours 35 minutes 20 seconds  Findings:      The perianal and digital rectal examinations were normal.      The entire examined colon appeared normal on direct and retroflexion       views. Impression:               - The entire examined colon is normal on direct and                            retroflexion views.                           - No specimens collected. Moderate Sedation:      Moderate (conscious) sedation was administered by the endoscopy nurse       and supervised by the endoscopist. The following parameters were       monitored: oxygen saturation, heart rate, blood pressure, and response       to care. Recommendation:           - Discharge patient to home (ambulatory).                           - High fiber diet today.                           - Continue present medications.                           -  Repeat colonoscopy in 10 years for screening                            purposes. Procedure Code(s):        --- Professional ---                           W0981, Colorectal cancer screening; colonoscopy on                            individual not meeting criteria for high risk Diagnosis Code(s):        --- Professional ---                           Z12.11, Encounter for screening for malignant                            neoplasm of colon CPT copyright 2017 American [employer]. All rights reserved. The codes documented in this report are preliminary and upon coder review may  be revised to meet current compliance requirements. Romie Levee, MD Romie Levee, MD 01/08/2018 12:38:15 PM This report has been signed electronically. Number of Addenda: 0

## 2018-01-08 NOTE — Discharge Instructions (Signed)
Post Colonoscopy Instructions ° °1. DIET: Follow a light bland diet the first 24 hours after arrival home, such as soup, liquids, crackers, etc.  Be sure to include lots of fluids daily.  Avoid fast food or heavy meals as your are more likely to get nauseated.   °2. You may have some mild rectal bleeding for the first few days after the procedure.  This should get less and less with time.  Resume any blood thinners 2 days after your procedure unless directed otherwise by your physician. °3. Take your usually prescribed home medications unless otherwise directed. °a. If you have any pain, it is helpful to get up and walk around, as it is usually from excess gas. °b. If this is not helpful, you can take an over-the-counter pain medication.  Choose one of the following that works best for you: °i. Naproxen (Aleve, etc)  Two 220mg tabs twice a day °ii. Ibuprofen (Advil, etc) Three 200mg tabs four times a day (every meal & bedtime) °iii. If you still have pain after using one of these, please call the office °4. It is normal to not have a bowel movement for 2-3 days after colonoscopy.   ° °5. ACTIVITIES as tolerated:   °6. You may resume regular (light) daily activities beginning the next day--such as daily self-care, walking, climbing stairs--gradually increasing activities as tolerated.  ° ° °WHEN TO CALL US [phone]: °1. Fever over 101.5 F (38.5 C)  °2. Severe abdominal or chest pain  °3. Large amount of rectal bleeding, passing multiple blood clots  °4. Dizziness or shortness of breath °5. Increasing nausea or vomiting ° ° The clinic staff is available to answer your questions during regular business hours (8:30am-5pm).  Please don’t hesitate to call and ask to speak to one of our nurses for clinical concerns.  ° If you have a medical emergency, go to the nearest emergency room or call 911. ° A surgeon from [facility] is always on call at the hospitals ° ° °[facility] °[address], [address], [place], [place]  [address] ? °MAIN: [phone] ? TOLL FREE: [phone] ?  °FAX [phone] °[email or web address] ° ° °

## 2018-01-09 ENCOUNTER — Encounter (HOSPITAL_COMMUNITY): Payer: Self-pay | Admitting: General Surgery

## 2018-02-27 IMAGING — CR DG CHEST 2V
2 series · 2 of 2 positions shown · non-contrast
Comparison: None.

CLINICAL DATA: Hypertension.

EXAM:
CHEST  2 VIEW

[w chest pa]
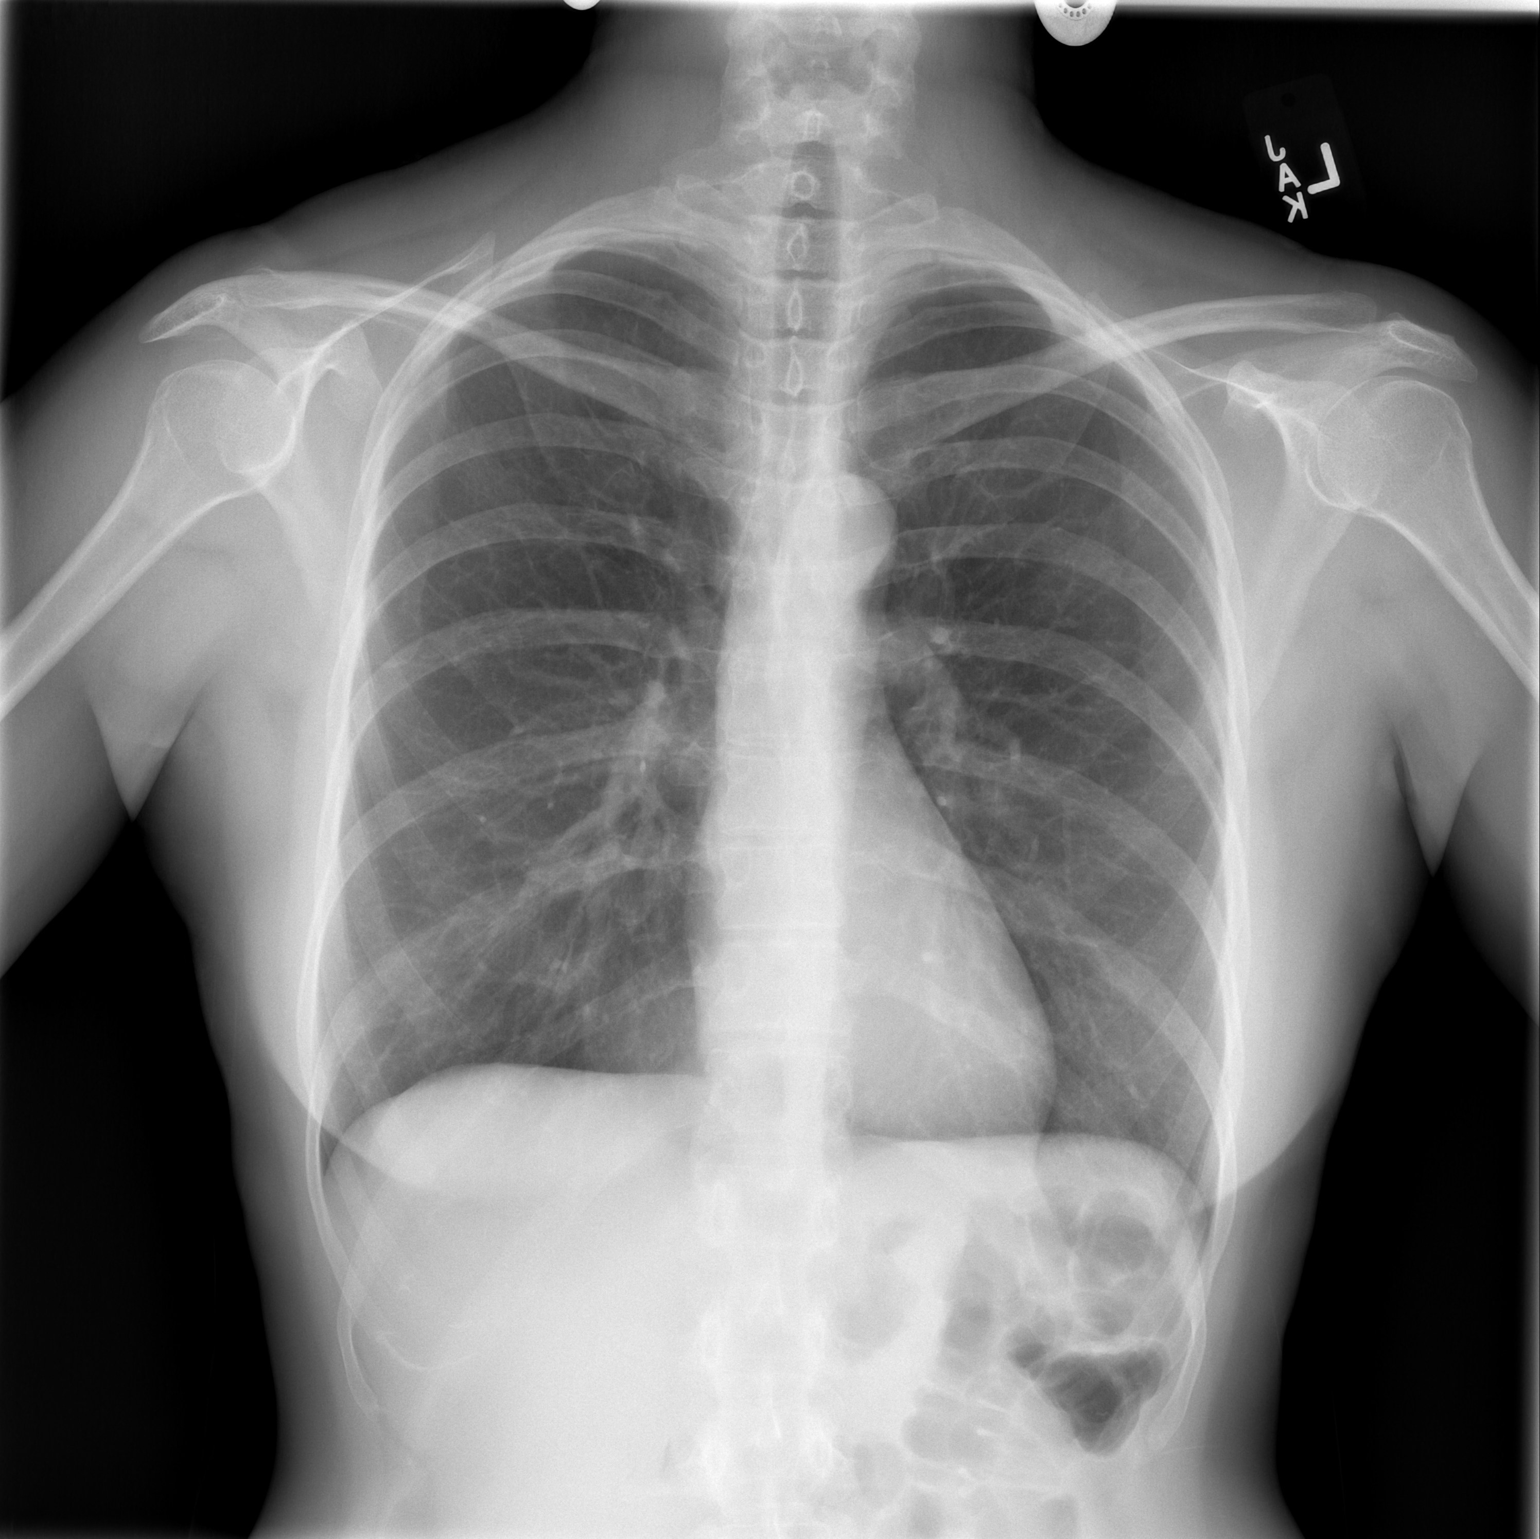

[w chest lat]
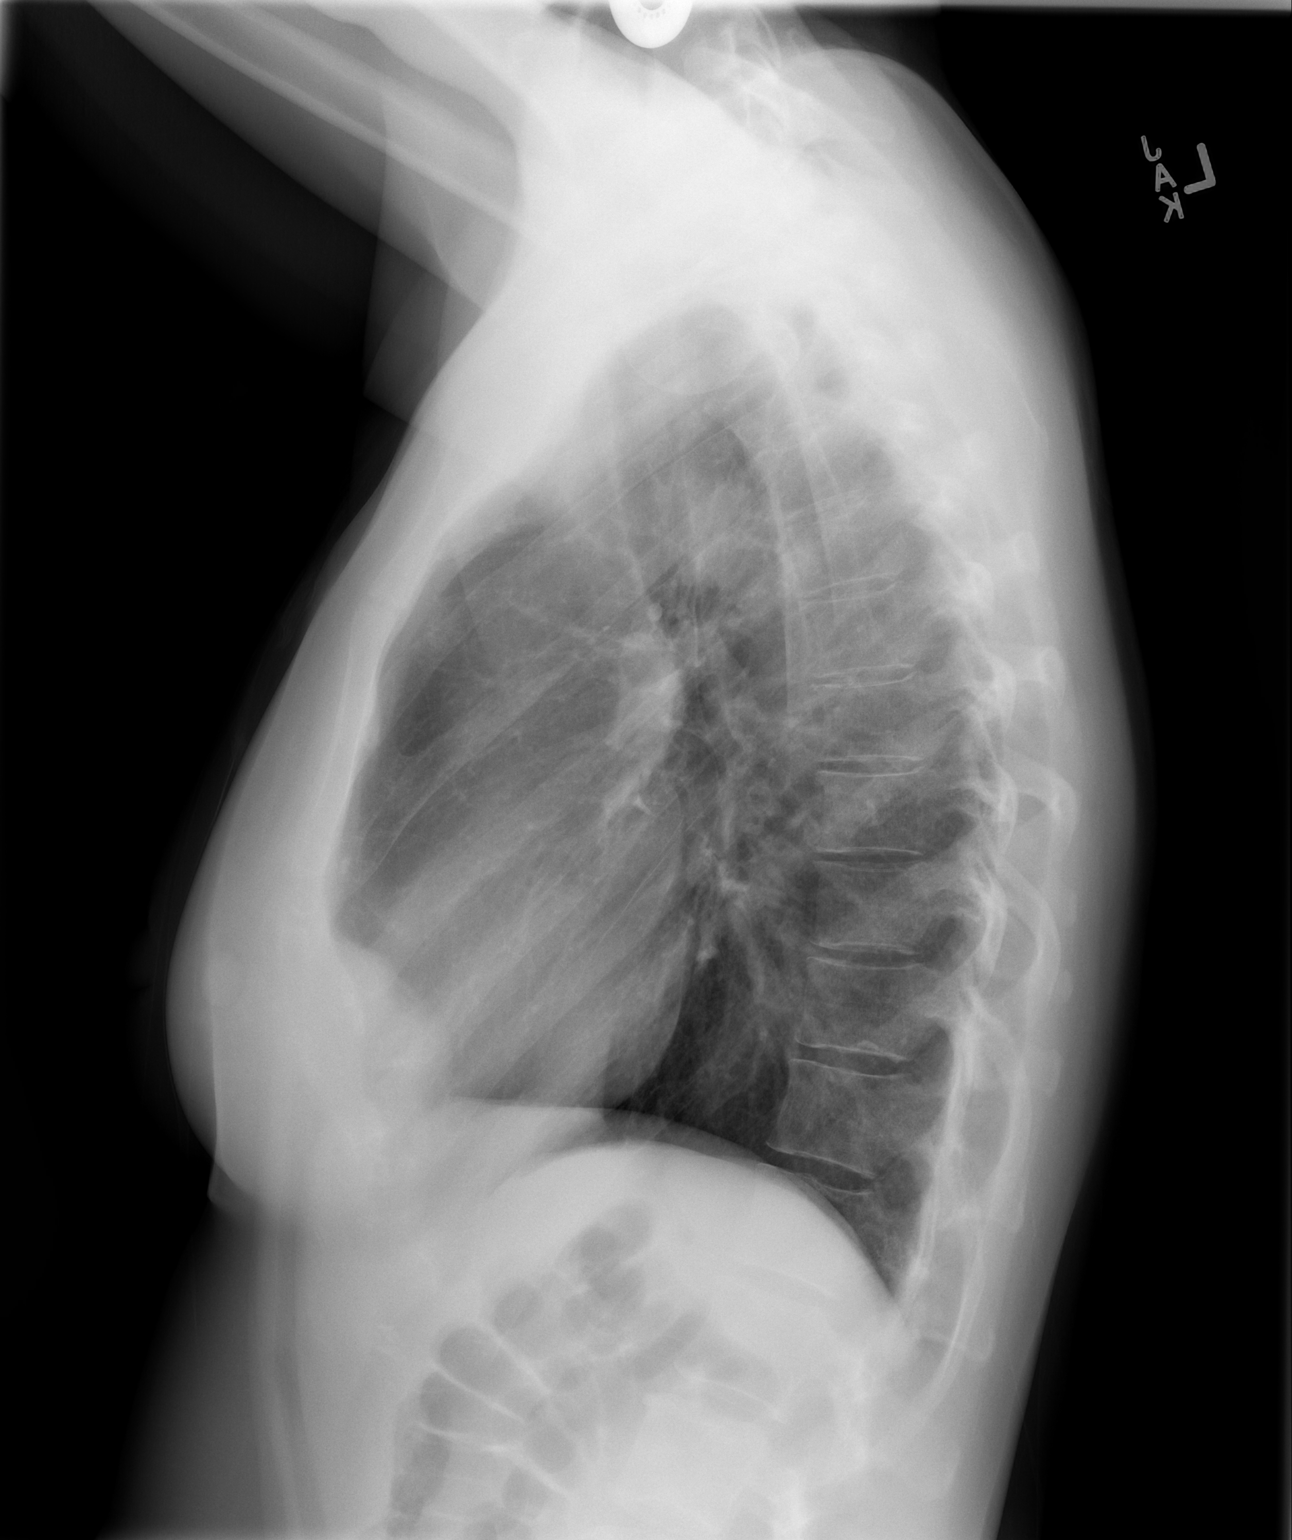

[2 of 2 positions shown; findings below may reference images not displayed]

FINDINGS: Midline trachea. Normal heart size and mediastinal contours. No
pleural effusion or pneumothorax. Clear lungs.
IMPRESSION: No acute cardiopulmonary disease.

## 2018-04-03 DIAGNOSIS — G4733 Obstructive sleep apnea (adult) (pediatric): Secondary | ICD-10-CM | POA: Diagnosis not present

## 2018-04-03 DIAGNOSIS — I119 Hypertensive heart disease without heart failure: Secondary | ICD-10-CM | POA: Diagnosis not present

## 2018-06-30 ENCOUNTER — Telehealth: Payer: Self-pay | Admitting: Cardiology

## 2018-06-30 NOTE — Telephone Encounter (Signed)
Jacqueline Roberts patient please have MD review records.

## 2018-06-30 NOTE — Telephone Encounter (Signed)
° ° °  1. Which medications need to be refilled? (please list name of each medication and dose if known) metoprolol succinate ER 100mg  tablet  2. Which pharmacy/location (including street and city if local pharmacy) is medication to be sent to? [facility] [address] [place]  3. Do they need a 30 day or 90 day supply? 30

## 2018-07-01 MED ORDER — METOPROLOL SUCCINATE ER 50 MG PO TB24: 150.0000 mg | ORAL_TABLET | Freq: Every day | ORAL | 0 refills | Status: DC

## 2018-07-01 NOTE — Telephone Encounter (Signed)
30 day supply metoprolol succinate sent to [facility] in [place]. Left message for patient to return call to schedule her 3 month follow up appointment.

## 2018-07-07 ENCOUNTER — Telehealth: Payer: Self-pay | Admitting: Cardiology

## 2018-07-07 NOTE — Telephone Encounter (Signed)
LAM for patient to return call to schedule 3 month f/up

## 2018-09-24 ENCOUNTER — Telehealth: Payer: Self-pay | Admitting: Cardiology

## 2018-09-24 MED ORDER — HYDROCHLOROTHIAZIDE 12.5 MG PO CAPS: 12.5000 mg | ORAL_CAPSULE | Freq: Every day | ORAL | 0 refills | Status: DC

## 2018-09-24 MED ORDER — METOPROLOL SUCCINATE ER 50 MG PO TB24: 150.0000 mg | ORAL_TABLET | Freq: Every day | ORAL | 0 refills | Status: DC

## 2018-09-24 MED ORDER — AMLODIPINE BESYLATE 5 MG PO TABS: 5.0000 mg | ORAL_TABLET | Freq: Every day | ORAL | 0 refills | Status: DC

## 2018-09-24 NOTE — Telephone Encounter (Signed)
Amlodipine 5 mg daily, hydrochlorothiazide 12.5 mg daily, and metoprolol succinate 150 mg daily refilled for 30 days per Dr. Tomie China.

## 2018-09-24 NOTE — Telephone Encounter (Signed)
°  Patient has upcoming appt, needs meds to get to that appt   1. Which medications need to be refilled? (please list name of each medication and dose if known) hydroclorothizide 12.5mg ; metoprolol succinate 50mg  24hr; amlodipine 5mg   2. Which pharmacy/location (including street and city if local pharmacy) is medication to be sent to?[facility] on [address] [place]  3. Do they need a 30 day or 90 day supply? 30

## 2018-10-02 ENCOUNTER — Ambulatory Visit (INDEPENDENT_AMBULATORY_CARE_PROVIDER_SITE_OTHER): Payer: BLUE CROSS/BLUE SHIELD | Admitting: Cardiology

## 2018-10-02 ENCOUNTER — Encounter: Payer: Self-pay | Admitting: Cardiology

## 2018-10-02 VITALS — BP 130/84 | HR 62 | Ht 63.0 in | Wt 133.8 lb

## 2018-10-02 DIAGNOSIS — E785 Hyperlipidemia, unspecified: Secondary | ICD-10-CM | POA: Diagnosis not present

## 2018-10-02 DIAGNOSIS — I10 Essential (primary) hypertension: Secondary | ICD-10-CM

## 2018-10-02 DIAGNOSIS — G4733 Obstructive sleep apnea (adult) (pediatric): Secondary | ICD-10-CM

## 2018-10-02 HISTORY — DX: Hyperlipidemia, unspecified: E78.5

## 2018-10-02 NOTE — Patient Instructions (Signed)
Medication Instructions:  Your physician recommends that you continue on your current medications as directed. Please refer to the Current Medication list given to you today.  If you need a refill on your cardiac medications before your next appointment, please call your pharmacy.   Lab work: None  If you have labs (blood work) drawn today and your tests are completely normal, you will receive your results only by: Marland Kitchen MyChart Message (if you have MyChart) OR . A paper copy in the mail If you have any lab test that is abnormal or we need to change your treatment, we will call you to review the results.  Testing/Procedures: You had an EKG today.   Your physician has requested that you have an echocardiogram. Echocardiography is a painless test that uses sound waves to create images of your heart. It provides your doctor with information about the size and shape of your heart and how well your heart's chambers and valves are working. This procedure takes approximately one hour. There are no restrictions for this procedure.  Follow-Up: At [facility], you and your health needs are our priority.  As part of our continuing mission to provide you with exceptional heart care, we have created designated Provider Care Teams.  These Care Teams include your primary Cardiologist (physician) and Advanced Practice Providers (APPs -  Physician Assistants and Nurse Practitioners) who all work together to provide you with the care you need, when you need it. You will need a follow up appointment in 5 months.  Please call our office 2 months in advance to schedule this appointment.       Echocardiogram An echocardiogram is a procedure that uses painless sound waves (ultrasound) to produce an image of the heart. Images from an echocardiogram can provide important information about:  Signs of coronary artery disease (CAD).  Aneurysm detection. An aneurysm is a weak or damaged part of an artery wall that  bulges out from the normal force of blood pumping through the body.  Heart size and shape. Changes in the size or shape of the heart can be associated with certain conditions, including heart failure, aneurysm, and CAD.  Heart muscle function.  Heart valve function.  Signs of a past heart attack.  Fluid buildup around the heart.  Thickening of the heart muscle.  A tumor or infectious growth around the heart valves. Tell a health care provider about:  Any allergies you have.  All medicines you are taking, including vitamins, herbs, eye drops, creams, and over-the-counter medicines.  Any blood disorders you have.  Any surgeries you have had.  Any medical conditions you have.  Whether you are pregnant or may be pregnant. What are the risks? Generally, this is a safe procedure. However, problems may occur, including:  Allergic reaction to dye (contrast) that may be used during the procedure. What happens before the procedure? No specific preparation is needed. You may eat and drink normally. What happens during the procedure?   An IV tube may be inserted into one of your veins.  You may receive contrast through this tube. A contrast is an injection that improves the quality of the pictures from your heart.  A gel will be applied to your chest.  A wand-like tool (transducer) will be moved over your chest. The gel will help to transmit the sound waves from the transducer.  The sound waves will harmlessly bounce off of your heart to allow the heart images to be captured in real-time motion. The  images will be recorded on a computer. The procedure may vary among health care providers and hospitals. What happens after the procedure?  You may return to your normal, everyday life, including diet, activities, and medicines, unless your health care provider tells you not to do that. Summary  An echocardiogram is a procedure that uses painless sound waves (ultrasound) to produce  an image of the heart.  Images from an echocardiogram can provide important information about the size and shape of your heart, heart muscle function, heart valve function, and fluid buildup around your heart.  You do not need to do anything to prepare before this procedure. You may eat and drink normally.  After the echocardiogram is completed, you may return to your normal, everyday life, unless your health care provider tells you not to do that. This information is not intended to replace advice given to you by your health care provider. Make sure you discuss any questions you have with your health care provider. Document Released: 08/17/2000 Document Revised: 09/22/2016 Document Reviewed: 09/22/2016 Elsevier Interactive Patient Education  2019 [employer].

## 2018-10-02 NOTE — Progress Notes (Signed)
Cardiology Consultation:    Date:  10/02/2018   ID:  Jacqueline Roberts, DOB [birth year], MRN [id number]  PCP:  Carrington Clamp, MD  Cardiologist:  Gypsy Balsam, MD   Referring MD: Carrington Clamp, MD   Chief Complaint  Patient presents with  . Follow-up  Here for just regular follow-up  History of Present Illness:    Jacqueline Roberts is a 47 y.o. female who is being seen today for the evaluation of hypertension at the request of Carrington Clamp, MD.  She does have history of hypertension diagnosed years ago.  Treated with medication however recently she ran out of it and her blood pressure was high between 1 5160 systolic she is back on the medication blood pressure is better controlled.  She did have a renal ultrasound done years ago which was normal.  She knows about needs to reduce the salt intake she knows about the fact that exercises helps with reduction of the blood pressure.  She also have sleep apnea however it is managed with some mouth device which I do not think it is too effective.  We spent a great deal of time talking about potentially doing sleep study with rechecking degree of apnea that she has and potentially in a implement appropriate therapy.  She said that she will think it over.  Denies have any chest pain tightness squeezing pressure burning chest.  Does not exercise on the regular basis but started in January and she started walking  Past Medical History:  Diagnosis Date  . 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 Irregular heart beat   . Palpitations   . Snoring     Past Surgical History:  Procedure Laterality Date  . COLONOSCOPY N/A 01/08/2018   Procedure: COLONOSCOPY ERAS PATHWAY;  Surgeon: Romie Levee, MD;  Location: [facility];  Service: Endoscopy;  Laterality: N/A;    Current Medications: Current Meds  Medication Sig  . amLODipine (NORVASC) 5 MG tablet Take 1 tablet (5 mg total) by mouth daily.  . hydrochlorothiazide  (MICROZIDE) 12.5 MG capsule Take 1 capsule (12.5 mg total) by mouth daily.  . metoprolol succinate (TOPROL-XL) 50 MG 24 hr tablet Take 3 tablets (150 mg total) by mouth daily.  . Multiple Vitamin (MULTIVITAMIN) tablet Take 1 tablet by mouth daily.     Allergies:   Patient has no known allergies.   Social History   Socioeconomic History  . Marital status: Married    Spouse name: Not on file  . Number of children: Not on file  . Years of education: Not on file  . Highest education level: Not on file  Occupational History  . Occupation: customer servie    Comment: Clinical cytogeneticist  Social Needs  . Financial resource strain: Not on file  . Food insecurity:    Worry: Not on file    Inability: Not on file  . Transportation needs:    Medical: Not on file    Non-medical: Not on file  Tobacco Use  . Smoking status: Never Smoker  . Smokeless tobacco: Never Used  Substance and Sexual Activity  . Alcohol use: Yes    Alcohol/week: 1.0 standard drinks    Types: 1 Glasses of wine per week    Comment: Red  . Drug use: No  . Sexual activity: Not on file  Lifestyle  . Physical activity:    Days per week: Not on file    Minutes per session: Not on file  .  Stress: Not on file  Relationships  . Social connections:    Talks on phone: Not on file    Gets together: Not on file    Attends religious service: Not on file    Active member of club or organization: Not on file    Attends meetings of clubs or organizations: Not on file    Relationship status: Not on file  Other Topics Concern  . Not on file  Social History Narrative   ** Merged History Encounter **         Family History: The patient's family history includes Healthy in her brother and brother; Hypertension in her brother and father; Hypothyroidism in her mother. ROS:   Please see the history of present illness.    All 14 point review of systems negative except as described per history of present  illness.  EKGs/Labs/Other Studies Reviewed:    The following studies were reviewed today:   EKG:  EKG is  ordered today.  The ekg ordered today demonstrates normal sinus rhythm normal P interval poor R wave progression anterior precordium nonspecific ST-T segment changes  Recent Labs: No results found for requested labs within last 8760 hours.  Recent Lipid Panel No results found for: CHOL, TRIG, HDL, CHOLHDL, VLDL, LDLCALC, LDLDIRECT  Physical Exam:    VS:  BP 130/84   Pulse 62   Ht 5\' 3"  (1.6 m)   Wt 133 lb 12.8 oz (60.7 kg)   SpO2 98%   BMI 23.70 kg/m     Wt Readings from Last 3 Encounters:  10/02/18 133 lb 12.8 oz (60.7 kg)  01/08/18 130 lb (59 kg)  05/14/17 130 lb 9.6 oz (59.2 kg)     GEN:  Well nourished, well developed in no acute distress HEENT: Normal NECK: No JVD; No carotid bruits LYMPHATICS: No lymphadenopathy CARDIAC: RRR, no murmurs, no rubs, no gallops RESPIRATORY:  Clear to auscultation without rales, wheezing or rhonchi  ABDOMEN: Soft, non-tender, non-distended MUSCULOSKELETAL:  No edema; No deformity  SKIN: Warm and dry NEUROLOGIC:  Alert and oriented x 3 PSYCHIATRIC:  Normal affect   ASSESSMENT:    1. Hypertension, essential   2. Obstructive sleep apnea   3. Dyslipidemia    PLAN:    In order of problems listed above:  1. Essential hypertension blood pressure control with the medications which I will continue.  We talked about salt reduction we talked about exercises with told her about weight loss she understands she will try to do it.  We will continue for now 2. Obstructive sleep apnea strongly recommend to have sleep study redone to recheck on degree of the problem as well as appropriate management of the issue 3. Is lipidemia she does have fasting lipid profile done.  The results that I have available to me are from 2 years ago and her HDL is very high but however LDL is still elevated.  We will ask her to have EKG done as well as  echocardiogram to look for evidence of left ventricle hypertrophy to determine need for aggressiveness of her therapy.   Medication Adjustments/Labs and Tests Ordered: Current medicines are reviewed at length with the patient today.  Concerns regarding medicines are outlined above.  No orders of the defined types were placed in this encounter.  No orders of the defined types were placed in this encounter.   Signed, [name], MD, [facility]. 10/02/2018 3:32 PM    [facility]

## 2018-10-13 ENCOUNTER — Other Ambulatory Visit (HOSPITAL_COMMUNITY): Payer: BLUE CROSS/BLUE SHIELD

## 2018-10-21 ENCOUNTER — Other Ambulatory Visit: Payer: Self-pay | Admitting: Cardiology

## 2018-10-21 DIAGNOSIS — J069 Acute upper respiratory infection, unspecified: Secondary | ICD-10-CM | POA: Diagnosis not present

## 2018-10-21 DIAGNOSIS — J040 Acute laryngitis: Secondary | ICD-10-CM | POA: Diagnosis not present

## 2018-10-21 MED ORDER — HYDROCHLOROTHIAZIDE 12.5 MG PO CAPS: 12.5000 mg | ORAL_CAPSULE | Freq: Every day | ORAL | 3 refills | Status: DC

## 2018-10-21 NOTE — Telephone Encounter (Signed)
Refill has been provided

## 2018-10-21 NOTE — Telephone Encounter (Signed)
° °  1. Which medications need to be refilled? (please list name of each medication and dose if known) Hydrochlorothiazide 12.5mg  once daily  2. Which pharmacy/location (including street and city if local pharmacy) is medication to be sent to? [facility]  3. Do they need a 30 day or 90 day supply? 60

## 2018-10-27 ENCOUNTER — Other Ambulatory Visit: Payer: Self-pay | Admitting: Cardiology

## 2018-10-27 ENCOUNTER — Ambulatory Visit (HOSPITAL_COMMUNITY): Payer: BLUE CROSS/BLUE SHIELD | Attending: Cardiology

## 2018-10-27 ENCOUNTER — Telehealth: Payer: Self-pay

## 2018-10-27 DIAGNOSIS — I10 Essential (primary) hypertension: Secondary | ICD-10-CM | POA: Insufficient documentation

## 2018-10-27 NOTE — Telephone Encounter (Signed)
30 day refill approval sent for metoprolol. Note attached requesting patient to call office to schedule 6 mo follow up

## 2018-11-26 ENCOUNTER — Other Ambulatory Visit: Payer: Self-pay | Admitting: Cardiology

## 2018-12-31 ENCOUNTER — Telehealth: Payer: Self-pay | Admitting: Cardiology

## 2018-12-31 MED ORDER — AMLODIPINE BESYLATE 10 MG PO TABS: ORAL_TABLET | ORAL | 1 refills | Status: DC

## 2018-12-31 MED ORDER — METOPROLOL SUCCINATE ER 50 MG PO TB24: ORAL_TABLET | ORAL | 1 refills | Status: DC

## 2018-12-31 NOTE — Telephone Encounter (Signed)
°*  STAT* If patient is at the pharmacy, call can be transferred to refill team.   1. Which medications need to be refilled? (please list name of each medication and dose if known) Metoprolol er succinate 100mg  tablets take 1-1/2 daily; Amlodipine 10mg  tablets one tablet daily  2. Which pharmacy/location (including street and city if local pharmacy) is medication to be sent to?[facility] [address] [place]  3. Do they need a 30 day or 90 day supply? Metoprolol is 180 Amlodipine is 90

## 2018-12-31 NOTE — Telephone Encounter (Addendum)
Metoprolol ER and Amlodipine refills sent to [facility] on [address] in [place] per pt preference    Patient also states that she had an echocardiogram done recently and received a bill for $1700. She had one done  1 1/2 years ago and wonders if insurance isn't paying because of that. She states she told someone in this office about it and wonders if they have found anything out about her charges. Directed patient to call her insurance company and find out what her plan  and get details about the billing of the procedure to which she agreed.

## 2019-01-05 ENCOUNTER — Telehealth: Payer: Self-pay | Admitting: Cardiology

## 2019-01-05 NOTE — Telephone Encounter (Deleted)
°*  STAT* If patient is at the pharmacy, call can be transferred to refill team. ° ° °1. Which medications need to be refilled? (please list name of each medication and dose if known) ***** ° °2. Which pharmacy/location (including street and city if local pharmacy) is medication to be sent to?**** ° °3. Do they need a 30 day or 90 day supply? *** ° °

## 2019-01-09 NOTE — Telephone Encounter (Signed)
error 

## 2019-01-30 ENCOUNTER — Ambulatory Visit: Payer: BLUE CROSS/BLUE SHIELD | Admitting: Orthopaedic Surgery

## 2019-02-10 ENCOUNTER — Ambulatory Visit: Payer: BLUE CROSS/BLUE SHIELD | Admitting: Orthopaedic Surgery

## 2019-02-11 ENCOUNTER — Ambulatory Visit: Payer: Self-pay

## 2019-02-11 ENCOUNTER — Ambulatory Visit: Payer: BLUE CROSS/BLUE SHIELD | Admitting: Orthopaedic Surgery

## 2019-02-11 ENCOUNTER — Other Ambulatory Visit: Payer: Self-pay

## 2019-02-11 ENCOUNTER — Encounter: Payer: Self-pay | Admitting: Orthopaedic Surgery

## 2019-02-11 VITALS — BP 136/93 | HR 61 | Ht 63.0 in | Wt 120.0 lb

## 2019-02-11 DIAGNOSIS — M79642 Pain in left hand: Secondary | ICD-10-CM

## 2019-02-11 DIAGNOSIS — M25521 Pain in right elbow: Secondary | ICD-10-CM | POA: Diagnosis not present

## 2019-02-11 HISTORY — DX: Pain in right elbow: M25.521

## 2019-02-11 HISTORY — DX: Pain in left hand: M79.642

## 2019-02-11 NOTE — Progress Notes (Signed)
Office Visit Note   Patient: Kevan RosebushLora G Behrend           Date of Birth: Nov 30, 1970           MRN: [id number] Visit Date: 02/11/2019              Requested by: Carrington ClampHorvath, Michelle, MD [address] [place] [id number] PCP: Carrington ClampHorvath, Michelle, MD   Assessment & Plan: Visit Diagnoses:  1. Pain in left hand   2. Pain in right elbow     Plan: Right medial elbow pain is consistent with medial epicondylitis.  Have discussed exercises elbow splint that she had for tennis elbow and Voltaren gel.  Can consider cortisone injection in the future. Not sure the cause of her left hand pain but there is no ecchymosis or swelling with full range of motion.  Neurologically intact.  Could have a problem with the intrinsic muscles between the fourth and fifth metacarpal.  No evidence of ligament injury distally tween the metacarpal heads.  No masses identified.  We will try the Voltaren gel and return in a month if no improvement  Follow-Up Instructions: No follow-ups on file.   Orders:  Orders Placed This Encounter  Procedures  . XR Hand Complete Left   No orders of the defined types were placed in this encounter.     Procedures: No procedures performed   Clinical Data: No additional findings.   Subjective: Chief Complaint  Patient presents with  . Left Hand - Pain  . Right Elbow - Pain  Patient presents today with left hand X1 month. No known. The pain is located along the fifth metacarpal and will sometimes radiate up her arm. The pain is there every day, but worsens with using that hand. She also has pain in her right elbow that has been present for a year. The pain is on the medial side and comes and goes. Today it is tender to the touch. She has taken OTC pain medicine as needed.  No related numbness or tingling in either left hand or right hand.  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  Eyes:     Pupils: Pupils are equal, round, and reactive to light.  Pulmonary:     Effort: Pulmonary effort is normal.  Skin:    General: Skin is warm and dry.  Neurological:     Mental Status: She is alert and oriented to person, place, and time.  Psychiatric:        Behavior: Behavior normal.     Ortho Exam awake alert and oriented x3.  Comfortable sitting.  Examination of the right elbow reveals full range of motion in pronation supination flexion extension.  There is local mild tenderness directly over the medial epicondyles.  The ulnar nerve is mobile but not uncomfortable.  No Tinel's. Left hand had some mild tenderness in the metacarpal region between the fourth and fifth metacarpals.  No pain along the metacarpal heads.  No ecchymosis.  No edema.  Good grip and good release.  Does have some hyperextension across the PIP joints  consistent with hyper aches.  No elbow pain no wrist pain.  Neurologically intact Specialty Comments:  No specialty comments available.  Imaging: No results found.   PMFS History: Patient Active Problem List   Diagnosis Date Noted  . Pain in left hand 02/11/2019  . Pain in right elbow 02/11/2019  . Dyslipidemia 10/02/2018  . Obstructive sleep apnea 05/14/2017  . Hypertension, essential 05/14/2017  . Seasonal and perennial allergic rhinitis 05/14/2017   Past Medical History:  Diagnosis Date  . 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 Irregular heart beat   . Palpitations   . Snoring     Family History  Problem Relation Age of Onset  . Hypothyroidism Mother   . Hypertension Father   . Healthy Brother   . Healthy Brother   . Hypertension Brother     Past Surgical History:  Procedure Laterality Date  .  COLONOSCOPY N/A 01/08/2018   Procedure: COLONOSCOPY ERAS PATHWAY;  Surgeon: Leighton Ruff, MD;  Location: [facility];  Service: Endoscopy;  Laterality: N/A;   Social History   Occupational History  . Occupation: customer servie    Comment: Education officer, community  Tobacco Use  . Smoking status: Never Smoker  . Smokeless tobacco: Never Used  Substance and Sexual Activity  . Alcohol use: Yes    Alcohol/week: 1.0 standard drinks    Types: 1 Glasses of wine per week    Comment: Red  . Drug use: No  . Sexual activity: Not on file

## 2019-02-12 ENCOUNTER — Ambulatory Visit: Payer: BLUE CROSS/BLUE SHIELD | Admitting: Orthopaedic Surgery

## 2019-04-02 ENCOUNTER — Other Ambulatory Visit: Payer: Self-pay | Admitting: Cardiology

## 2019-06-30 ENCOUNTER — Other Ambulatory Visit: Payer: Self-pay | Admitting: Cardiology

## 2019-07-17 DIAGNOSIS — Z03818 Encounter for observation for suspected exposure to other biological agents ruled out: Secondary | ICD-10-CM | POA: Diagnosis not present

## 2019-07-30 ENCOUNTER — Other Ambulatory Visit: Payer: Self-pay | Admitting: Cardiology

## 2019-08-03 NOTE — Telephone Encounter (Signed)
Hctz refill sent to [facility] on [address], [place]. Overdue for appt.

## 2019-09-17 ENCOUNTER — Other Ambulatory Visit: Payer: Self-pay | Admitting: Cardiology

## 2019-09-21 DIAGNOSIS — M542 Cervicalgia: Secondary | ICD-10-CM | POA: Diagnosis not present

## 2019-09-21 DIAGNOSIS — M79674 Pain in right toe(s): Secondary | ICD-10-CM | POA: Diagnosis not present

## 2019-09-21 DIAGNOSIS — M25522 Pain in left elbow: Secondary | ICD-10-CM | POA: Diagnosis not present

## 2019-09-29 ENCOUNTER — Encounter: Payer: Self-pay | Admitting: Cardiology

## 2019-09-29 ENCOUNTER — Ambulatory Visit (INDEPENDENT_AMBULATORY_CARE_PROVIDER_SITE_OTHER): Payer: BC Managed Care – PPO | Admitting: Cardiology

## 2019-09-29 ENCOUNTER — Other Ambulatory Visit: Payer: Self-pay

## 2019-09-29 VITALS — BP 134/90 | HR 62 | Ht 63.0 in | Wt 127.0 lb

## 2019-09-29 DIAGNOSIS — G4733 Obstructive sleep apnea (adult) (pediatric): Secondary | ICD-10-CM | POA: Diagnosis not present

## 2019-09-29 DIAGNOSIS — E785 Hyperlipidemia, unspecified: Secondary | ICD-10-CM | POA: Diagnosis not present

## 2019-09-29 DIAGNOSIS — I10 Essential (primary) hypertension: Secondary | ICD-10-CM | POA: Diagnosis not present

## 2019-09-29 NOTE — Progress Notes (Signed)
Cardiology Office Note:    Date:  09/29/2019   ID:  Jacqueline Roberts, DOB [birth year], MRN [id number]  PCP:  Carrington Clamp, MD  Cardiologist:  Gypsy Balsam, MD    Referring MD: Carrington Clamp, MD   Chief Complaint  Patient presents with  . Follow-up    1 YR FU     History of Present Illness:    Jacqueline Roberts is a 49 y.o. female with past medical history significant for hypertension, quite extensive evaluation has been done try to find the reason for hypertension looks like we dealing with idiopathic essential hypertension.  She is doing very well she does not check her blood pressure on a regular basis.  She exercise regularly.  She is trying to keep up her diet.  Overall feels good  Past Medical History:  Diagnosis Date  . 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 Irregular heart beat   . Palpitations   . Snoring     Past Surgical History:  Procedure Laterality Date  . COLONOSCOPY N/A 01/08/2018   Procedure: COLONOSCOPY ERAS PATHWAY;  Surgeon: Romie Levee, MD;  Location: [facility];  Service: Endoscopy;  Laterality: N/A;    Current Medications: Current Meds  Medication Sig  . amLODipine (NORVASC) 10 MG tablet TAKE 1/2 TABLET(5 MG) BY MOUTH EVERY DAY  . hydrochlorothiazide (MICROZIDE) 12.5 MG capsule TAKE 1 CAPSULE(12.5 MG) BY MOUTH DAILY. FOLLOW-UP VISIT REQUIRED FOR FURTHER REFILLS*  . metoprolol succinate (TOPROL-XL) 50 MG 24 hr tablet TAKE 3 TABLETS BY MOUTH EVERY DAY  . Multiple Vitamin (MULTIVITAMIN) tablet Take 1 tablet by mouth daily.     Allergies:   Patient has no known allergies.   Social History   Socioeconomic History  . Marital status: Married    Spouse name: Not on file  . Number of children: Not on file  . Years of education: Not on file  . Highest education level: Not on file  Occupational History  . Occupation: customer servie    Comment: Clinical cytogeneticist  Tobacco Use  . Smoking status: Never Smoker  .  Smokeless tobacco: Never Used  Substance and Sexual Activity  . Alcohol use: Yes    Alcohol/week: 1.0 standard drinks    Types: 1 Glasses of wine per week    Comment: Red  . Drug use: No  . Sexual activity: Not on file  Other Topics Concern  . Not on file  Social History Narrative   ** Merged History Encounter **       Social Determinants of Health   Financial Resource Strain:   . Difficulty of Paying Living Expenses: Not on file  Food Insecurity:   . Worried About Programme researcher, broadcasting/film/video in the Last Year: Not on file  . Ran Out of Food in the Last Year: Not on file  Transportation Needs:   . Lack of Transportation (Medical): Not on file  . Lack of Transportation (Non-Medical): Not on file  Physical Activity:   . Days of Exercise per Week: Not on file  . Minutes of Exercise per Session: Not on file  Stress:   . Feeling of Stress : Not on file  Social Connections:   . Frequency of Communication with Friends and Family: Not on file  . Frequency of Social Gatherings with Friends and Family: Not on file  . Attends Religious Services: Not on file  . Active Member of Clubs or Organizations: Not on file  . Attends Club or  Organization Meetings: Not on file  . Marital Status: Not on file     Family History: The patient's family history includes Healthy in her brother and brother; Hypertension in her brother and father; Hypothyroidism in her mother. ROS:   Please see the history of present illness.    All 14 point review of systems negative except as described per history of present illness  EKGs/Labs/Other Studies Reviewed:      Recent Labs: No results found for requested labs within last 8760 hours.  Recent Lipid Panel No results found for: CHOL, TRIG, HDL, CHOLHDL, VLDL, LDLCALC, LDLDIRECT  Physical Exam:    VS:  BP 134/90   Pulse 62   Ht 5\' 3"  (1.6 m)   Wt 127 lb (57.6 kg)   SpO2 99%   BMI 22.50 kg/m     Wt Readings from Last 3 Encounters:  09/29/19 127 lb (57.6  kg)  02/11/19 120 lb (54.4 kg)  10/02/18 133 lb 12.8 oz (60.7 kg)     GEN:  Well nourished, well developed in no acute distress HEENT: Normal NECK: No JVD; No carotid bruits LYMPHATICS: No lymphadenopathy CARDIAC: RRR, no murmurs, no rubs, no gallops RESPIRATORY:  Clear to auscultation without rales, wheezing or rhonchi  ABDOMEN: Soft, non-tender, non-distended MUSCULOSKELETAL:  No edema; No deformity  SKIN: Warm and dry LOWER EXTREMITIES: no swelling NEUROLOGIC:  Alert and oriented x 3 PSYCHIATRIC:  Normal affect   ASSESSMENT:    1. Dyslipidemia   2. Hypertension, essential   3. Obstructive sleep apnea    PLAN:    In order of problems listed above:  1. Dyslipidemia I will ask her to have fasting lipid profile done today. 2. Essential hypertension appears to be well controlled I encouraged her to check blood pressure may be about once a week. 3. Obstructive sleep apnea she is some mild device which seems to be helping. 4. We spent some time talking about healthy lifestyle, about appropriate exercises on the regular basis as well as good eating habits and which includes low-salt diet.   Medication Adjustments/Labs and Tests Ordered: Current medicines are reviewed at length with the patient today.  Concerns regarding medicines are outlined above.  Orders Placed This Encounter  Procedures  . Lipid Profile   Medication changes: No orders of the defined types were placed in this encounter.   Signed, Park Liter, MD, [facility] 09/29/2019 4:35 PM    [place]

## 2019-09-29 NOTE — Patient Instructions (Signed)
Medication Instructions:  Your physician recommends that you continue on your current medications as directed. Please refer to the Current Medication list given to you today.  *If you need a refill on your cardiac medications before your next appointment, please call your pharmacy*  Lab Work: Your physician recommends that you return for lab work today: lipid   If you have labs (blood work) drawn today and your tests are completely normal, you will receive your results only by: Marland Kitchen MyChart Message (if you have MyChart) OR . A paper copy in the mail If you have any lab test that is abnormal or we need to change your treatment, we will call you to review the results.  Testing/Procedures: None.   Follow-Up: At [facility], you and your health needs are our priority.  As part of our continuing mission to provide you with exceptional heart care, we have created designated Provider Care Teams.  These Care Teams include your primary Cardiologist (physician) and Advanced Practice Providers (APPs -  Physician Assistants and Nurse Practitioners) who all work together to provide you with the care you need, when you need it.  Your next appointment:   1 year(s)  The format for your next appointment:   In Person  Provider:   Gypsy Balsam, MD  Other Instructions

## 2019-09-30 LAB — LIPID PANEL
Chol/HDL Ratio: 2.7 ratio (ref 0.0–4.4)
Cholesterol, Total: 223 mg/dL — ABNORMAL HIGH (ref 100–199)
HDL: 83 mg/dL (ref >39)
LDL Chol Calc (NIH): 121 mg/dL — ABNORMAL HIGH (ref 0–99)
Triglycerides: 109 mg/dL (ref 0–149)
VLDL Cholesterol Cal: 19 mg/dL (ref 5–40)

## 2019-09-30 NOTE — Addendum Note (Signed)
Addended by: Lily Kocher on: 09/30/2019 01:14 PM   Modules accepted: Orders

## 2019-10-02 ENCOUNTER — Other Ambulatory Visit: Payer: Self-pay | Admitting: Cardiology

## 2019-10-14 DIAGNOSIS — Z6822 Body mass index (BMI) 22.0-22.9, adult: Secondary | ICD-10-CM | POA: Diagnosis not present

## 2019-10-14 DIAGNOSIS — Z01419 Encounter for gynecological examination (general) (routine) without abnormal findings: Secondary | ICD-10-CM | POA: Diagnosis not present

## 2019-10-14 DIAGNOSIS — Z124 Encounter for screening for malignant neoplasm of cervix: Secondary | ICD-10-CM | POA: Diagnosis not present

## 2019-10-14 DIAGNOSIS — Z1231 Encounter for screening mammogram for malignant neoplasm of breast: Secondary | ICD-10-CM | POA: Diagnosis not present

## 2019-10-14 DIAGNOSIS — R87610 Atypical squamous cells of undetermined significance on cytologic smear of cervix (ASC-US): Secondary | ICD-10-CM | POA: Diagnosis not present

## 2019-12-27 ENCOUNTER — Other Ambulatory Visit: Payer: Self-pay | Admitting: Cardiology

## 2020-03-26 ENCOUNTER — Other Ambulatory Visit: Payer: Self-pay | Admitting: Cardiology

## 2020-05-04 DIAGNOSIS — R87610 Atypical squamous cells of undetermined significance on cytologic smear of cervix (ASC-US): Secondary | ICD-10-CM | POA: Diagnosis not present

## 2020-05-04 DIAGNOSIS — Z6822 Body mass index (BMI) 22.0-22.9, adult: Secondary | ICD-10-CM | POA: Diagnosis not present

## 2020-06-24 ENCOUNTER — Other Ambulatory Visit: Payer: Self-pay | Admitting: Cardiology

## 2020-08-24 DIAGNOSIS — M25561 Pain in right knee: Secondary | ICD-10-CM | POA: Diagnosis not present

## 2020-08-24 DIAGNOSIS — M25562 Pain in left knee: Secondary | ICD-10-CM | POA: Diagnosis not present

## 2020-09-07 DIAGNOSIS — Z20822 Contact with and (suspected) exposure to covid-19: Secondary | ICD-10-CM | POA: Diagnosis not present

## 2020-09-07 DIAGNOSIS — R059 Cough, unspecified: Secondary | ICD-10-CM | POA: Diagnosis not present

## 2020-09-14 ENCOUNTER — Other Ambulatory Visit: Payer: Self-pay

## 2020-09-14 DIAGNOSIS — R030 Elevated blood-pressure reading, without diagnosis of hypertension: Secondary | ICD-10-CM

## 2020-09-14 DIAGNOSIS — R0683 Snoring: Secondary | ICD-10-CM | POA: Insufficient documentation

## 2020-09-14 DIAGNOSIS — R002 Palpitations: Secondary | ICD-10-CM | POA: Insufficient documentation

## 2020-09-14 DIAGNOSIS — I499 Cardiac arrhythmia, unspecified: Secondary | ICD-10-CM | POA: Insufficient documentation

## 2020-09-14 DIAGNOSIS — N920 Excessive and frequent menstruation with regular cycle: Secondary | ICD-10-CM

## 2020-09-14 DIAGNOSIS — I119 Hypertensive heart disease without heart failure: Secondary | ICD-10-CM | POA: Insufficient documentation

## 2020-09-14 HISTORY — DX: Excessive and frequent menstruation with regular cycle: N92.0

## 2020-09-14 HISTORY — DX: Elevated blood-pressure reading, without diagnosis of hypertension: R03.0

## 2020-09-16 ENCOUNTER — Ambulatory Visit (INDEPENDENT_AMBULATORY_CARE_PROVIDER_SITE_OTHER): Payer: BC Managed Care – PPO | Admitting: Cardiology

## 2020-09-16 ENCOUNTER — Other Ambulatory Visit: Payer: Self-pay

## 2020-09-16 ENCOUNTER — Encounter: Payer: Self-pay | Admitting: Cardiology

## 2020-09-16 VITALS — BP 124/86 | HR 68 | Ht 63.0 in | Wt 126.0 lb

## 2020-09-16 DIAGNOSIS — R002 Palpitations: Secondary | ICD-10-CM

## 2020-09-16 DIAGNOSIS — E785 Hyperlipidemia, unspecified: Secondary | ICD-10-CM

## 2020-09-16 DIAGNOSIS — I10 Essential (primary) hypertension: Secondary | ICD-10-CM | POA: Diagnosis not present

## 2020-09-16 NOTE — Addendum Note (Signed)
Addended by: Roddie Mc on: 09/16/2020 08:57 AM   Modules accepted: Orders

## 2020-09-16 NOTE — Patient Instructions (Signed)
Medication Instructions:  Your physician recommends that you continue on your current medications as directed. Please refer to the Current Medication list given to you today.  *If you need a refill on your cardiac medications before your next appointment, please call your pharmacy*   Lab Work: Your physician recommends that you return for lab work in: TODAY FOR LIPID PANEL TO CHECK CHOLESTEROL  If you have labs (blood work) drawn today and your tests are completely normal, you will receive your results only by: Marland Kitchen MyChart Message (if you have MyChart) OR . A paper copy in the mail If you have any lab test that is abnormal or we need to change your treatment, we will call you to review the results.   Testing/Procedures: EKG   Follow-Up: At [facility], you and your health needs are our priority.  As part of our continuing mission to provide you with exceptional heart care, we have created designated Provider Care Teams.  These Care Teams include your primary Cardiologist (physician) and Advanced Practice Providers (APPs -  Physician Assistants and Nurse Practitioners) who all work together to provide you with the care you need, when you need it.  We recommend signing up for the patient portal called "MyChart".  Sign up information is provided on this After Visit Summary.  MyChart is used to connect with patients for Virtual Visits (Telemedicine).  Patients are able to view lab/test results, encounter notes, upcoming appointments, etc.  Non-urgent messages can be sent to your provider as well.   To learn more about what you can do with MyChart, go to [email or web address].    Your next appointment:   1 year(s)  The format for your next appointment:   In Person  Provider:   Gypsy Balsam, MD   Other Instructions

## 2020-09-16 NOTE — Progress Notes (Signed)
Cardiology Office Note:    Date:  09/16/2020   ID:  Jacqueline Roberts, DOB May 31, 1971, MRN [id number]  PCP:  Carrington Clamp, MD  Cardiologist:  Gypsy Balsam, MD    Referring MD: Carrington Clamp, MD   Chief Complaint  Patient presents with  . Follow-up  Am doing well  History of Present Illness:    Jacqueline Roberts is a 50 y.o. female with past medical history significant for essential hypertension, dyslipidemia, obstructive sleep apnea.  Comes today 2 months of follow-up.  Overall doing well.  Denies have any chest pain tightness squeezing pressure burning chest.  She admits that she does not exercise on the regular basis she says she comes from work late and then she have no time and does not feel like exercising.  Overall asymptomatic.  Past Medical History:  Diagnosis Date  . Dyslipidemia 10/02/2018  . Elevated blood-pressure reading without diagnosis of hypertension 09/14/2020  . Hypertension, essential 05/14/2017  . 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 Irregular heart beat   . Menorrhagia 09/14/2020  . Obstructive sleep apnea 05/14/2017   Home sleep test 04/23/17 AHI 10.4/ hour, desaturation to 78% with mean saturation 95%, body weight 128 pounds  . Pain in left hand 02/11/2019  . Pain in right elbow 02/11/2019  . Palpitations   . Seasonal and perennial allergic rhinitis 05/14/2017  . Snoring     Past Surgical History:  Procedure Laterality Date  . COLONOSCOPY N/A 01/08/2018   Procedure: COLONOSCOPY ERAS PATHWAY;  Surgeon: Romie Levee, MD;  Location: [facility];  Service: Endoscopy;  Laterality: N/A;    Current Medications: Current Meds  Medication Sig  . amLODipine (NORVASC) 10 MG tablet TAKE 1/2 TABLET(5 MG) BY MOUTH EVERY DAY  . hydrochlorothiazide (MICROZIDE) 12.5 MG capsule Take 1 capsule (12.5 mg total) by mouth daily.  . metoprolol succinate (TOPROL-XL) 50 MG 24 hr tablet TAKE 3 TABLETS BY MOUTH EVERY DAY  . Multiple Vitamin (MULTIVITAMIN)  tablet Take 1 tablet by mouth daily.     Allergies:   Patient has no known allergies.   Social History   Socioeconomic History  . Marital status: Married    Spouse name: Not on file  . Number of children: Not on file  . Years of education: Not on file  . Highest education level: Not on file  Occupational History  . Occupation: customer servie    Comment: Clinical cytogeneticist  Tobacco Use  . Smoking status: Never Smoker  . Smokeless tobacco: Never Used  Substance and Sexual Activity  . Alcohol use: Yes    Alcohol/week: 1.0 standard drink    Types: 1 Glasses of wine per week    Comment: Red  . Drug use: No  . Sexual activity: Not on file  Other Topics Concern  . Not on file  Social History Narrative   ** Merged History Encounter **       Social Determinants of Health   Financial Resource Strain: Not on file  Food Insecurity: Not on file  Transportation Needs: Not on file  Physical Activity: Not on file  Stress: Not on file  Social Connections: Not on file     Family History: The patient's family history includes Healthy in her brother and brother; Hypertension in her brother and father; Hypothyroidism in her mother. ROS:   Please see the history of present illness.    All 14 point review of systems negative except as described per history of present  illness  EKGs/Labs/Other Studies Reviewed:      Recent Labs: No results found for requested labs within last 8760 hours.  Recent Lipid Panel    Component Value Date/Time   CHOL 223 (H) 09/29/2019 1610   TRIG 109 09/29/2019 1610   HDL 83 09/29/2019 1610   CHOLHDL 2.7 09/29/2019 1610   LDLCALC 121 (H) 09/29/2019 1610    Physical Exam:    VS:  BP 124/86 (BP Location: Right Arm, Patient Position: Sitting)   Pulse 68   Ht 5\' 3"  (1.6 m)   Wt 126 lb (57.2 kg)   SpO2 98%   BMI 22.32 kg/m     Wt Readings from Last 3 Encounters:  09/16/20 126 lb (57.2 kg)  09/29/19 127 lb (57.6 kg)  02/11/19 120 lb (54.4  kg)     GEN:  Well nourished, well developed in no acute distress HEENT: Normal NECK: No JVD; No carotid bruits LYMPHATICS: No lymphadenopathy CARDIAC: RRR, no murmurs, no rubs, no gallops RESPIRATORY:  Clear to auscultation without rales, wheezing or rhonchi  ABDOMEN: Soft, non-tender, non-distended MUSCULOSKELETAL:  No edema; No deformity  SKIN: Warm and dry LOWER EXTREMITIES: no swelling NEUROLOGIC:  Alert and oriented x 3 PSYCHIATRIC:  Normal affect   ASSESSMENT:    1. Hypertension, essential   2. Palpitations   3. Dyslipidemia    PLAN:    In order of problems listed above:  1. Essential hypertension blood pressure well controlled we will continue present management which include beta-blocker as well as calcium channel blocker. 2. Palpitations: Denies having any doing well from that point review. 3. Dyslipidemia I did review her K PN which show me her LDL of 121 HDL of 83 I did calculated her 10 years predicted risk for coronary event which was 0.9% which is very low, there is no need to treat this however diet exercise is always recommended.  I will recheck her cholesterol today. 4. We did talk about need to exercise on the regular basis good diet   Medication Adjustments/Labs and Tests Ordered: Current medicines are reviewed at length with the patient today.  Concerns regarding medicines are outlined above.  No orders of the defined types were placed in this encounter.  Medication changes: No orders of the defined types were placed in this encounter.   Signed, 04/13/19, MD, [facility] 09/16/2020 8:50 AM    [facility]

## 2020-09-17 LAB — LIPID PANEL
Chol/HDL Ratio: 3 ratio (ref 0.0–4.4)
Cholesterol, Total: 229 mg/dL — ABNORMAL HIGH (ref 100–199)
HDL: 77 mg/dL (ref >39)
LDL Chol Calc (NIH): 127 mg/dL — ABNORMAL HIGH (ref 0–99)
Triglycerides: 146 mg/dL (ref 0–149)
VLDL Cholesterol Cal: 25 mg/dL (ref 5–40)

## 2020-09-22 ENCOUNTER — Other Ambulatory Visit: Payer: Self-pay | Admitting: Cardiology

## 2020-09-22 NOTE — Telephone Encounter (Signed)
Rx refill sent to pharmacy. 

## 2020-09-28 DIAGNOSIS — M25561 Pain in right knee: Secondary | ICD-10-CM | POA: Diagnosis not present

## 2021-02-24 DIAGNOSIS — M25561 Pain in right knee: Secondary | ICD-10-CM | POA: Diagnosis not present

## 2021-02-24 DIAGNOSIS — M545 Low back pain, unspecified: Secondary | ICD-10-CM | POA: Diagnosis not present

## 2021-03-01 DIAGNOSIS — S8001XA Contusion of right knee, initial encounter: Secondary | ICD-10-CM | POA: Diagnosis not present

## 2021-03-29 ENCOUNTER — Other Ambulatory Visit: Payer: Self-pay | Admitting: Cardiology

## 2021-03-29 NOTE — Telephone Encounter (Signed)
Metoprolol succinate 50 mg tablet  # 270 x 2 refills Amlodipine 10 mg tablet  # 45 x 2 refills Hydrochlorothiazide 12.5 mg tablet # 90 x 2 refills Sent to [id number] - [place], [place] - [address] AT [facility] OF [address]

## 2021-04-12 DIAGNOSIS — Z01419 Encounter for gynecological examination (general) (routine) without abnormal findings: Secondary | ICD-10-CM | POA: Diagnosis not present

## 2021-04-12 DIAGNOSIS — Z6822 Body mass index (BMI) 22.0-22.9, adult: Secondary | ICD-10-CM | POA: Diagnosis not present

## 2021-04-12 DIAGNOSIS — Z124 Encounter for screening for malignant neoplasm of cervix: Secondary | ICD-10-CM | POA: Diagnosis not present

## 2021-04-12 DIAGNOSIS — R87810 Cervical high risk human papillomavirus (HPV) DNA test positive: Secondary | ICD-10-CM | POA: Diagnosis not present

## 2021-06-05 DIAGNOSIS — Z1231 Encounter for screening mammogram for malignant neoplasm of breast: Secondary | ICD-10-CM | POA: Diagnosis not present

## 2021-11-09 ENCOUNTER — Ambulatory Visit: Payer: BC Managed Care – PPO | Admitting: Cardiology

## 2021-11-23 ENCOUNTER — Encounter: Payer: Self-pay | Admitting: Cardiology

## 2021-11-23 ENCOUNTER — Other Ambulatory Visit: Payer: Self-pay

## 2021-11-23 ENCOUNTER — Ambulatory Visit: Payer: BC Managed Care – PPO | Admitting: Cardiology

## 2021-11-23 VITALS — BP 128/74 | HR 66 | Ht 63.0 in | Wt 130.0 lb

## 2021-11-23 DIAGNOSIS — R002 Palpitations: Secondary | ICD-10-CM

## 2021-11-23 DIAGNOSIS — I10 Essential (primary) hypertension: Secondary | ICD-10-CM

## 2021-11-23 DIAGNOSIS — E785 Hyperlipidemia, unspecified: Secondary | ICD-10-CM

## 2021-11-23 NOTE — Patient Instructions (Signed)
Medication Instructions:  ?Your physician recommends that you continue on your current medications as directed. Please refer to the Current Medication list given to you today. ? ?*If you need a refill on your cardiac medications before your next appointment, please call your pharmacy* ? ? ?Lab Work: ?Your physician recommends that you return for lab work in:  ? ?Labs today: Lipids ? ?If you have labs (blood work) drawn today and your tests are completely normal, you will receive your results only by: ?MyChart Message (if you have MyChart) OR ?A paper copy in the mail ?If you have any lab test that is abnormal or we need to change your treatment, we will call you to review the results. ? ? ?Testing/Procedures: ?None ? ? ?Follow-Up: ?At [facility], you and your health needs are our priority.  As part of our continuing mission to provide you with exceptional heart care, we have created designated Provider Care Teams.  These Care Teams include your primary Cardiologist (physician) and Advanced Practice Providers (APPs -  Physician Assistants and Nurse Practitioners) who all work together to provide you with the care you need, when you need it. ? ?We recommend signing up for the patient portal called "MyChart".  Sign up information is provided on this After Visit Summary.  MyChart is used to connect with patients for Virtual Visits (Telemedicine).  Patients are able to view lab/test results, encounter notes, upcoming appointments, etc.  Non-urgent messages can be sent to your provider as well.   ?To learn more about what you can do with MyChart, go to [email or web address].   ? ?Your next appointment:   ?1 year(s) ? ?The format for your next appointment:   ?In Person ? ?Provider:   ?Gypsy Balsam, MD  ? ? ?Other Instructions ?None ? ?

## 2021-11-23 NOTE — Addendum Note (Signed)
Addended by: Roxanne Mins I on: 11/23/2021 11:33 AM ? ? Modules accepted: Orders ? ?

## 2021-11-23 NOTE — Progress Notes (Signed)
?Cardiology Office Note:   ? ?Date:  11/23/2021  ? ?ID:  Jacqueline Roberts, DOB 1971/01/31, MRN [id number] ? ?PCP:  Carrington Clamp, MD  ?Cardiologist:  Gypsy Balsam, MD   ? ?Referring MD: Carrington Clamp, MD  ? ?Chief Complaint  ?Patient presents with  ? Follow-up  ?I am doing well ? ?History of Present Illness:   ? ?Jacqueline Roberts is a 51 y.o. female with past medical history significant for essential hypertension, dyslipidemia.  She comes today to my office for follow-up overall doing very well.  She started exercising on the regular basis feeling better.  Denies of any chest pain tightness squeezing pressure burning chest no palpitation dizziness swelling of lower extremities. ? ?Past Medical History:  ?Diagnosis Date  ? Dyslipidemia 10/02/2018  ? Elevated blood-pressure reading without diagnosis of hypertension 09/14/2020  ? Hypertension, essential 05/14/2017  ? Hypertensive heart disease without heart failure   ? Irregular heart beat   ? Menorrhagia 09/14/2020  ? Obstructive sleep apnea 05/14/2017  ? Home sleep test 04/23/17 AHI 10.4/ hour, desaturation to 78% with mean saturation 95%, body weight 128 pounds  ? Pain in left hand 02/11/2019  ? Pain in right elbow 02/11/2019  ? Palpitations   ? Seasonal and perennial allergic rhinitis 05/14/2017  ? Snoring   ? ? ?Past Surgical History:  ?Procedure Laterality Date  ? COLONOSCOPY N/A 01/08/2018  ? Procedure: COLONOSCOPY ERAS PATHWAY;  Surgeon: Romie Levee, MD;  Location: [facility];  Service: Endoscopy;  Laterality: N/A;  ? ? ?Current Medications: ?Current Meds  ?Medication Sig  ? amLODipine (NORVASC) 10 MG tablet TAKE 1/2 TABLET(5 MG) BY MOUTH EVERY DAY (Patient taking differently: Take 10 mg by mouth daily. TAKE 1/2 TABLET(5 MG) BY MOUTH EVERY DAY)  ? hydrochlorothiazide (MICROZIDE) 12.5 MG capsule TAKE 1 CAPSULE(12.5 MG) BY MOUTH DAILY (Patient taking differently: Take 12.5 mg by mouth daily.)  ? metoprolol succinate (TOPROL-XL) 50 MG 24 hr tablet TAKE 3  TABLETS BY MOUTH EVERY DAY (Patient taking differently: Take 150 mg by mouth daily. TAKE 3 TABLETS BY MOUTH EVERY DAY)  ? Multiple Vitamin (MULTIVITAMIN) tablet Take 1 tablet by mouth daily.  ?  ? ?Allergies:   Patient has no known allergies.  ? ?Social History  ? ?Socioeconomic History  ? Marital status: Married  ?  Spouse name: Not on file  ? Number of children: Not on file  ? Years of education: Not on file  ? Highest education level: Not on file  ?Occupational History  ? Occupation: customer servie  ?  Comment: Clinical cytogeneticist  ?Tobacco Use  ? Smoking status: Never  ? Smokeless tobacco: Never  ?Substance and Sexual Activity  ? Alcohol use: Yes  ?  Alcohol/week: 1.0 standard drink  ?  Types: 1 Glasses of wine per week  ?  Comment: Red  ? Drug use: No  ? Sexual activity: Not on file  ?Other Topics Concern  ? Not on file  ?Social History Narrative  ? ** Merged History Encounter **  ?    ? ?Social Determinants of Health  ? ?Financial Resource Strain: Not on file  ?Food Insecurity: Not on file  ?Transportation Needs: Not on file  ?Physical Activity: Not on file  ?Stress: Not on file  ?Social Connections: Not on file  ?  ? ?Family History: ?The patient's family history includes Healthy in her brother and brother; Hypertension in her brother and father; Hypothyroidism in her mother. ?ROS:   ?Please see the  history of present illness.    ?All 14 point review of systems negative except as described per history of present illness ? ?EKGs/Labs/Other Studies Reviewed:   ? ? ? ?Recent Labs: ?No results found for requested labs within last 8760 hours.  ?Recent Lipid Panel ?   ?Component Value Date/Time  ? CHOL 229 (H) 09/16/2020 0902  ? TRIG 146 09/16/2020 0902  ? HDL 77 09/16/2020 0902  ? CHOLHDL 3.0 09/16/2020 0902  ? LDLCALC 127 (H) 09/16/2020 0902  ? ? ?Physical Exam:   ? ?VS:  BP 128/74 (BP Location: Left Arm, Patient Position: Sitting)   Pulse 66   Ht 5\' 3"  (1.6 m)   Wt 130 lb (59 kg)   SpO2 98%   BMI 23.03  kg/m?    ? ?Wt Readings from Last 3 Encounters:  ?11/23/21 130 lb (59 kg)  ?09/16/20 126 lb (57.2 kg)  ?09/29/19 127 lb (57.6 kg)  ?  ? ?GEN:  Well nourished, well developed in no acute distress ?HEENT: Normal ?NECK: No JVD; No carotid bruits ?LYMPHATICS: No lymphadenopathy ?CARDIAC: RRR, no murmurs, no rubs, no gallops ?RESPIRATORY:  Clear to auscultation without rales, wheezing or rhonchi  ?ABDOMEN: Soft, non-tender, non-distended ?MUSCULOSKELETAL:  No edema; No deformity  ?SKIN: Warm and dry ?LOWER EXTREMITIES: no swelling ?NEUROLOGIC:  Alert and oriented x 3 ?PSYCHIATRIC:  Normal affect  ? ?ASSESSMENT:   ? ?1. Hypertension, essential   ?2. Dyslipidemia   ?3. Palpitations   ? ?PLAN:   ? ?In order of problems listed above: ? ?Essential hypertension: Blood pressure perfectly controlled medication which I will continue. ?Dyslipidemia I did review her lab work test from before HDL 77 LDL 127 total cholesterol 10/01/19 I did calculate her predicted 10 years risk for having coronary event which was only 1.3.  No need to treat with medications however diet discussed with the patient I did discuss basic of Mediterranean diet. ?Palpitations denies having any. ?Overall I think she is doing very well.  Continue exercises stick with good diet ? ? ?Medication Adjustments/Labs and Tests Ordered: ?Current medicines are reviewed at length with the patient today.  Concerns regarding medicines are outlined above.  ?No orders of the defined types were placed in this encounter. ? ?Medication changes: No orders of the defined types were placed in this encounter. ? ? ?Signed, ?932, MD, [facility] ?11/23/2021 11:26 AM    ?[facility] ?

## 2021-11-24 LAB — LIPID PANEL
Chol/HDL Ratio: 2.8 ratio (ref 0.0–4.4)
Cholesterol, Total: 281 mg/dL — ABNORMAL HIGH (ref 100–199)
HDL: 99 mg/dL (ref >39)
LDL Chol Calc (NIH): 161 mg/dL — ABNORMAL HIGH (ref 0–99)
Triglycerides: 123 mg/dL (ref 0–149)
VLDL Cholesterol Cal: 21 mg/dL (ref 5–40)

## 2021-12-05 ENCOUNTER — Telehealth: Payer: Self-pay

## 2021-12-05 DIAGNOSIS — E785 Hyperlipidemia, unspecified: Secondary | ICD-10-CM

## 2021-12-05 NOTE — Telephone Encounter (Signed)
-----   Message from [name], MD sent at 11/30/2021 10:49 AM EDT ----- ?Interesting, her LDL is much worse it was 127 last year now 161 goodness is also HDL is also high which is 99.  She needs to stick with the diet with exercise and fasting lipid profile to be repeated in 3 months ?

## 2021-12-06 DIAGNOSIS — M79642 Pain in left hand: Secondary | ICD-10-CM | POA: Diagnosis not present

## 2021-12-20 DIAGNOSIS — M79642 Pain in left hand: Secondary | ICD-10-CM | POA: Diagnosis not present

## 2021-12-24 ENCOUNTER — Other Ambulatory Visit: Payer: Self-pay | Admitting: Cardiology

## 2022-01-04 DIAGNOSIS — M79642 Pain in left hand: Secondary | ICD-10-CM | POA: Diagnosis not present

## 2022-01-12 DIAGNOSIS — M79642 Pain in left hand: Secondary | ICD-10-CM | POA: Diagnosis not present

## 2022-02-07 DIAGNOSIS — L821 Other seborrheic keratosis: Secondary | ICD-10-CM | POA: Diagnosis not present

## 2022-02-07 DIAGNOSIS — L814 Other melanin hyperpigmentation: Secondary | ICD-10-CM | POA: Diagnosis not present

## 2022-02-07 DIAGNOSIS — L720 Epidermal cyst: Secondary | ICD-10-CM | POA: Diagnosis not present

## 2022-02-07 DIAGNOSIS — D225 Melanocytic nevi of trunk: Secondary | ICD-10-CM | POA: Diagnosis not present

## 2022-02-07 DIAGNOSIS — L570 Actinic keratosis: Secondary | ICD-10-CM | POA: Diagnosis not present

## 2022-02-07 DIAGNOSIS — D485 Neoplasm of uncertain behavior of skin: Secondary | ICD-10-CM | POA: Diagnosis not present

## 2022-02-07 DIAGNOSIS — D2261 Melanocytic nevi of right upper limb, including shoulder: Secondary | ICD-10-CM | POA: Diagnosis not present

## 2022-02-07 DIAGNOSIS — L439 Lichen planus, unspecified: Secondary | ICD-10-CM | POA: Diagnosis not present

## 2022-05-01 DIAGNOSIS — M545 Low back pain, unspecified: Secondary | ICD-10-CM | POA: Diagnosis not present

## 2022-05-01 DIAGNOSIS — I10 Essential (primary) hypertension: Secondary | ICD-10-CM | POA: Diagnosis not present

## 2022-05-17 DIAGNOSIS — I10 Essential (primary) hypertension: Secondary | ICD-10-CM | POA: Diagnosis not present

## 2022-05-17 DIAGNOSIS — E782 Mixed hyperlipidemia: Secondary | ICD-10-CM | POA: Diagnosis not present

## 2022-05-17 DIAGNOSIS — R600 Localized edema: Secondary | ICD-10-CM | POA: Diagnosis not present

## 2022-05-22 DIAGNOSIS — Z114 Encounter for screening for human immunodeficiency virus [HIV]: Secondary | ICD-10-CM | POA: Diagnosis not present

## 2022-05-22 DIAGNOSIS — Z0000 Encounter for general adult medical examination without abnormal findings: Secondary | ICD-10-CM | POA: Diagnosis not present

## 2022-06-07 DIAGNOSIS — Z01419 Encounter for gynecological examination (general) (routine) without abnormal findings: Secondary | ICD-10-CM | POA: Diagnosis not present

## 2022-06-07 DIAGNOSIS — Z6824 Body mass index (BMI) 24.0-24.9, adult: Secondary | ICD-10-CM | POA: Diagnosis not present

## 2022-06-07 DIAGNOSIS — Z124 Encounter for screening for malignant neoplasm of cervix: Secondary | ICD-10-CM | POA: Diagnosis not present

## 2022-06-07 DIAGNOSIS — Z1231 Encounter for screening mammogram for malignant neoplasm of breast: Secondary | ICD-10-CM | POA: Diagnosis not present

## 2022-06-11 DIAGNOSIS — Z0000 Encounter for general adult medical examination without abnormal findings: Secondary | ICD-10-CM | POA: Diagnosis not present

## 2022-06-11 DIAGNOSIS — R635 Abnormal weight gain: Secondary | ICD-10-CM | POA: Diagnosis not present

## 2022-06-11 DIAGNOSIS — I10 Essential (primary) hypertension: Secondary | ICD-10-CM | POA: Diagnosis not present

## 2022-06-11 DIAGNOSIS — Z23 Encounter for immunization: Secondary | ICD-10-CM | POA: Diagnosis not present

## 2022-06-11 DIAGNOSIS — R7989 Other specified abnormal findings of blood chemistry: Secondary | ICD-10-CM | POA: Diagnosis not present

## 2022-06-27 DIAGNOSIS — R7989 Other specified abnormal findings of blood chemistry: Secondary | ICD-10-CM | POA: Diagnosis not present

## 2022-06-27 DIAGNOSIS — E039 Hypothyroidism, unspecified: Secondary | ICD-10-CM | POA: Diagnosis not present

## 2022-07-03 DIAGNOSIS — I10 Essential (primary) hypertension: Secondary | ICD-10-CM | POA: Diagnosis not present

## 2022-07-03 DIAGNOSIS — R600 Localized edema: Secondary | ICD-10-CM | POA: Diagnosis not present

## 2022-07-03 DIAGNOSIS — E039 Hypothyroidism, unspecified: Secondary | ICD-10-CM | POA: Diagnosis not present

## 2022-07-03 DIAGNOSIS — R7989 Other specified abnormal findings of blood chemistry: Secondary | ICD-10-CM | POA: Diagnosis not present

## 2022-08-10 DIAGNOSIS — E559 Vitamin D deficiency, unspecified: Secondary | ICD-10-CM | POA: Diagnosis not present

## 2022-08-10 DIAGNOSIS — E039 Hypothyroidism, unspecified: Secondary | ICD-10-CM | POA: Diagnosis not present

## 2022-08-10 DIAGNOSIS — E782 Mixed hyperlipidemia: Secondary | ICD-10-CM | POA: Diagnosis not present

## 2022-08-14 DIAGNOSIS — E559 Vitamin D deficiency, unspecified: Secondary | ICD-10-CM | POA: Diagnosis not present

## 2022-08-14 DIAGNOSIS — E039 Hypothyroidism, unspecified: Secondary | ICD-10-CM | POA: Diagnosis not present

## 2022-08-14 DIAGNOSIS — I10 Essential (primary) hypertension: Secondary | ICD-10-CM | POA: Diagnosis not present

## 2022-08-14 DIAGNOSIS — E782 Mixed hyperlipidemia: Secondary | ICD-10-CM | POA: Diagnosis not present

## 2022-09-18 DIAGNOSIS — D225 Melanocytic nevi of trunk: Secondary | ICD-10-CM | POA: Diagnosis not present

## 2022-09-18 DIAGNOSIS — L578 Other skin changes due to chronic exposure to nonionizing radiation: Secondary | ICD-10-CM | POA: Diagnosis not present

## 2022-09-18 DIAGNOSIS — L570 Actinic keratosis: Secondary | ICD-10-CM | POA: Diagnosis not present

## 2022-09-18 DIAGNOSIS — L814 Other melanin hyperpigmentation: Secondary | ICD-10-CM | POA: Diagnosis not present

## 2022-09-25 ENCOUNTER — Ambulatory Visit: Payer: BC Managed Care – PPO | Admitting: Podiatry

## 2022-09-25 ENCOUNTER — Ambulatory Visit (INDEPENDENT_AMBULATORY_CARE_PROVIDER_SITE_OTHER): Payer: BC Managed Care – PPO

## 2022-09-25 ENCOUNTER — Encounter: Payer: Self-pay | Admitting: Podiatry

## 2022-09-25 DIAGNOSIS — M722 Plantar fascial fibromatosis: Secondary | ICD-10-CM

## 2022-09-25 MED ORDER — TRIAMCINOLONE ACETONIDE 40 MG/ML IJ SUSP
20.0000 mg | Freq: Once | INTRAMUSCULAR | Status: AC
  Administered 2022-09-25: 20 mg

## 2022-09-25 NOTE — Progress Notes (Signed)
Subjective:  Patient ID: Jacqueline Roberts, female    DOB: [birth year],  MRN: [id number] HPI Chief Complaint  Patient presents with   Foot Pain    Follow up plantar fibroma bilateral - last seen 2017, still having nodules come up in arch, puffiness around them, tender on the right   New Patient (Initial Visit)    Est pt 2017    52 y.o. female presents with the above complaint.   ROS: Denies fever chills nausea vomit muscle aches pains calf pain back pain chest pain shortness of breath.  Past Medical History:  Diagnosis Date   Dyslipidemia 10/02/2018   Elevated blood-pressure reading without diagnosis of hypertension 09/14/2020   Hypertension, essential 05/14/2017   Hypertensive heart disease without heart failure    Irregular heart beat    Menorrhagia 09/14/2020   Obstructive sleep apnea 05/14/2017   Home sleep test 04/23/17 AHI 10.4/ hour, desaturation to 78% with mean saturation 95%, body weight 128 pounds   Pain in left hand 02/11/2019   Pain in right elbow 02/11/2019   Palpitations    Seasonal and perennial allergic rhinitis 05/14/2017   Snoring    Past Surgical History:  Procedure Laterality Date   COLONOSCOPY N/A 01/08/2018   Procedure: COLONOSCOPY ERAS PATHWAY;  Surgeon: Leighton Ruff, MD;  Location: [facility];  Service: Endoscopy;  Laterality: N/A;    Current Outpatient Medications:    levothyroxine (SYNTHROID) 25 MCG tablet, Take 25 mcg by mouth daily., Disp: , Rfl:    losartan (COZAAR) 100 MG tablet, Take 100 mg by mouth daily., Disp: , Rfl:    tretinoin (RETIN-A) 0.05 % cream, Apply topically., Disp: , Rfl:    amLODipine (NORVASC) 10 MG tablet, Take 0.5 tablets (5 mg total) by mouth daily. TAKE 1/2 TABLET(5 MG) BY MOUTH EVERY DAY, Disp: 45 tablet, Rfl: 3   hydrochlorothiazide (MICROZIDE) 12.5 MG capsule, Take 1 capsule (12.5 mg total) by mouth daily., Disp: 90 capsule, Rfl: 3   metoprolol succinate (TOPROL-XL) 50 MG 24 hr tablet, Take 3 tablets (150 mg total) by mouth  daily. TAKE 3 TABLETS BY MOUTH EVERY DAY, Disp: 270 tablet, Rfl: 3   Multiple Vitamin (MULTIVITAMIN) tablet, Take 1 tablet by mouth daily., Disp: , Rfl:   No Known Allergies Review of Systems Objective:  There were no vitals filed for this visit.  General: Well developed, nourished, in no acute distress, alert and oriented x3   Dermatological: Skin is warm, dry and supple bilateral. Nails x 10 are well maintained; remaining integument appears unremarkable at this time. There are no open sores, no preulcerative lesions, no rash or signs of infection present.  Vascular: Dorsalis Pedis artery and Posterior Tibial artery pedal pulses are 2/4 bilateral with immedate capillary fill time. Pedal hair growth present. No varicosities and no lower extremity edema present bilateral.   Neruologic: Grossly intact via light touch bilateral. Vibratory intact via tuning fork bilateral. Protective threshold with Semmes Wienstein monofilament intact to all pedal sites bilateral. Patellar and Achilles deep tendon reflexes 2+ bilateral. No Babinski or clonus noted bilateral.   Musculoskeletal: No gross boney pedal deformities bilateral. No pain, crepitus, or limitation noted with foot and ankle range of motion bilateral. Muscular strength 5/5 in all groups tested bilateral.  Plantar fibromas bilateral right greater than left centrally located in the medial band of the plantar fascia.  Gait: Unassisted, Nonantalgic.    Radiographs:  Radiographs taken today do not demonstrate any calcification within the plantar ligament.  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  Will follow-up with her on an as-needed basis.     [name], [place]

## 2022-12-27 ENCOUNTER — Other Ambulatory Visit: Payer: Self-pay | Admitting: Cardiology

## 2022-12-31 ENCOUNTER — Other Ambulatory Visit: Payer: Self-pay | Admitting: Cardiology

## 2023-01-01 ENCOUNTER — Other Ambulatory Visit: Payer: Self-pay | Admitting: Cardiology

## 2023-03-22 DIAGNOSIS — L570 Actinic keratosis: Secondary | ICD-10-CM | POA: Diagnosis not present

## 2023-03-22 DIAGNOSIS — L821 Other seborrheic keratosis: Secondary | ICD-10-CM | POA: Diagnosis not present

## 2023-03-22 DIAGNOSIS — I10 Essential (primary) hypertension: Secondary | ICD-10-CM | POA: Diagnosis not present

## 2023-03-22 DIAGNOSIS — D225 Melanocytic nevi of trunk: Secondary | ICD-10-CM | POA: Diagnosis not present

## 2023-03-22 DIAGNOSIS — R7989 Other specified abnormal findings of blood chemistry: Secondary | ICD-10-CM | POA: Diagnosis not present

## 2023-03-22 DIAGNOSIS — L814 Other melanin hyperpigmentation: Secondary | ICD-10-CM | POA: Diagnosis not present

## 2023-03-22 DIAGNOSIS — E782 Mixed hyperlipidemia: Secondary | ICD-10-CM | POA: Diagnosis not present

## 2023-03-29 DIAGNOSIS — Z789 Other specified health status: Secondary | ICD-10-CM | POA: Diagnosis not present

## 2023-03-29 DIAGNOSIS — I10 Essential (primary) hypertension: Secondary | ICD-10-CM | POA: Diagnosis not present

## 2023-03-29 DIAGNOSIS — E039 Hypothyroidism, unspecified: Secondary | ICD-10-CM | POA: Diagnosis not present

## 2023-03-29 DIAGNOSIS — E782 Mixed hyperlipidemia: Secondary | ICD-10-CM | POA: Diagnosis not present

## 2023-04-29 ENCOUNTER — Ambulatory Visit: Payer: BC Managed Care – PPO | Admitting: Podiatry

## 2023-05-10 ENCOUNTER — Ambulatory Visit: Payer: BC Managed Care – PPO | Admitting: Podiatry

## 2023-05-10 ENCOUNTER — Encounter: Payer: Self-pay | Admitting: Podiatry

## 2023-05-10 DIAGNOSIS — M722 Plantar fascial fibromatosis: Secondary | ICD-10-CM

## 2023-05-12 NOTE — Progress Notes (Signed)
Subjective:   Patient ID: Jacqueline Roberts, female   DOB: 52 y.o.   MRN: [id number]   HPI Patient presents stating she still has a lot of pain in the mid arch area right over left and stated that injections were not really good for her and she is wondering what else can be done   ROS      Objective:  Physical Exam  Neurovascular status intact with nodular formation of the medial band of the fascia quite elongated with pain right over left     Assessment:  Plantar fibroma formation bilateral plantar arch right over left     Plan:  H&P reviewed and were going to start her on verapamil cream to try to see if we can reduce the size along with night splint with heat ice therapy to try to reduce the inflammatory component.  Ultimately may require surgery and I made her aware of that if we cannot get good response

## 2023-05-14 DIAGNOSIS — M542 Cervicalgia: Secondary | ICD-10-CM | POA: Diagnosis not present

## 2023-06-14 DIAGNOSIS — I10 Essential (primary) hypertension: Secondary | ICD-10-CM | POA: Diagnosis not present

## 2023-06-14 DIAGNOSIS — E782 Mixed hyperlipidemia: Secondary | ICD-10-CM | POA: Diagnosis not present

## 2023-06-14 DIAGNOSIS — Z13220 Encounter for screening for lipoid disorders: Secondary | ICD-10-CM | POA: Diagnosis not present

## 2023-06-14 DIAGNOSIS — E039 Hypothyroidism, unspecified: Secondary | ICD-10-CM | POA: Diagnosis not present

## 2023-06-21 DIAGNOSIS — Z0000 Encounter for general adult medical examination without abnormal findings: Secondary | ICD-10-CM | POA: Diagnosis not present

## 2023-06-21 DIAGNOSIS — E782 Mixed hyperlipidemia: Secondary | ICD-10-CM | POA: Diagnosis not present

## 2023-06-21 DIAGNOSIS — D72819 Decreased white blood cell count, unspecified: Secondary | ICD-10-CM | POA: Diagnosis not present

## 2023-07-25 DIAGNOSIS — Z1231 Encounter for screening mammogram for malignant neoplasm of breast: Secondary | ICD-10-CM | POA: Diagnosis not present

## 2023-07-25 DIAGNOSIS — Z124 Encounter for screening for malignant neoplasm of cervix: Secondary | ICD-10-CM | POA: Diagnosis not present

## 2023-07-25 DIAGNOSIS — Z01419 Encounter for gynecological examination (general) (routine) without abnormal findings: Secondary | ICD-10-CM | POA: Diagnosis not present

## 2024-01-31 DIAGNOSIS — R5383 Other fatigue: Secondary | ICD-10-CM | POA: Diagnosis not present

## 2024-01-31 DIAGNOSIS — I10 Essential (primary) hypertension: Secondary | ICD-10-CM | POA: Diagnosis not present

## 2024-01-31 DIAGNOSIS — E559 Vitamin D deficiency, unspecified: Secondary | ICD-10-CM | POA: Diagnosis not present

## 2024-02-03 DIAGNOSIS — I10 Essential (primary) hypertension: Secondary | ICD-10-CM | POA: Diagnosis not present

## 2024-02-03 DIAGNOSIS — E039 Hypothyroidism, unspecified: Secondary | ICD-10-CM | POA: Diagnosis not present

## 2024-02-03 DIAGNOSIS — Z79899 Other long term (current) drug therapy: Secondary | ICD-10-CM | POA: Diagnosis not present

## 2024-02-03 DIAGNOSIS — E559 Vitamin D deficiency, unspecified: Secondary | ICD-10-CM | POA: Diagnosis not present

## 2024-03-04 DIAGNOSIS — I10 Essential (primary) hypertension: Secondary | ICD-10-CM | POA: Diagnosis not present

## 2024-03-04 DIAGNOSIS — R600 Localized edema: Secondary | ICD-10-CM | POA: Diagnosis not present

## 2024-03-04 DIAGNOSIS — E039 Hypothyroidism, unspecified: Secondary | ICD-10-CM | POA: Diagnosis not present

## 2024-03-08 DIAGNOSIS — S65516A Laceration of blood vessel of right little finger, initial encounter: Secondary | ICD-10-CM | POA: Diagnosis not present

## 2024-03-08 DIAGNOSIS — L03011 Cellulitis of right finger: Secondary | ICD-10-CM | POA: Diagnosis not present

## 2024-03-08 DIAGNOSIS — Z23 Encounter for immunization: Secondary | ICD-10-CM | POA: Diagnosis not present

## 2024-03-20 DIAGNOSIS — E559 Vitamin D deficiency, unspecified: Secondary | ICD-10-CM | POA: Diagnosis not present

## 2024-03-20 DIAGNOSIS — R5383 Other fatigue: Secondary | ICD-10-CM | POA: Diagnosis not present

## 2024-03-27 DIAGNOSIS — L821 Other seborrheic keratosis: Secondary | ICD-10-CM | POA: Diagnosis not present

## 2024-03-27 DIAGNOSIS — L814 Other melanin hyperpigmentation: Secondary | ICD-10-CM | POA: Diagnosis not present

## 2024-03-27 DIAGNOSIS — L720 Epidermal cyst: Secondary | ICD-10-CM | POA: Diagnosis not present

## 2024-03-27 DIAGNOSIS — D485 Neoplasm of uncertain behavior of skin: Secondary | ICD-10-CM | POA: Diagnosis not present

## 2024-03-27 DIAGNOSIS — D225 Melanocytic nevi of trunk: Secondary | ICD-10-CM | POA: Diagnosis not present

## 2024-04-03 DIAGNOSIS — R600 Localized edema: Secondary | ICD-10-CM | POA: Diagnosis not present

## 2024-04-03 DIAGNOSIS — R55 Syncope and collapse: Secondary | ICD-10-CM | POA: Diagnosis not present

## 2024-04-03 DIAGNOSIS — R0683 Snoring: Secondary | ICD-10-CM | POA: Diagnosis not present

## 2024-04-03 DIAGNOSIS — I10 Essential (primary) hypertension: Secondary | ICD-10-CM | POA: Diagnosis not present

## 2024-04-03 DIAGNOSIS — R400 Somnolence: Secondary | ICD-10-CM | POA: Diagnosis not present

## 2024-06-12 DIAGNOSIS — Z0000 Encounter for general adult medical examination without abnormal findings: Secondary | ICD-10-CM | POA: Diagnosis not present

## 2024-06-15 DIAGNOSIS — M25522 Pain in left elbow: Secondary | ICD-10-CM | POA: Diagnosis not present

## 2024-06-26 DIAGNOSIS — Z872 Personal history of diseases of the skin and subcutaneous tissue: Secondary | ICD-10-CM | POA: Diagnosis not present

## 2024-06-26 DIAGNOSIS — Z0000 Encounter for general adult medical examination without abnormal findings: Secondary | ICD-10-CM | POA: Diagnosis not present

## 2024-06-26 DIAGNOSIS — D1722 Benign lipomatous neoplasm of skin and subcutaneous tissue of left arm: Secondary | ICD-10-CM | POA: Diagnosis not present

## 2024-06-26 DIAGNOSIS — E782 Mixed hyperlipidemia: Secondary | ICD-10-CM | POA: Diagnosis not present

## 2024-06-26 DIAGNOSIS — D1801 Hemangioma of skin and subcutaneous tissue: Secondary | ICD-10-CM | POA: Diagnosis not present

## 2024-06-26 DIAGNOSIS — L814 Other melanin hyperpigmentation: Secondary | ICD-10-CM | POA: Diagnosis not present

## 2024-06-26 DIAGNOSIS — D485 Neoplasm of uncertain behavior of skin: Secondary | ICD-10-CM | POA: Diagnosis not present

## 2024-06-26 DIAGNOSIS — D225 Melanocytic nevi of trunk: Secondary | ICD-10-CM | POA: Diagnosis not present

## 2024-07-28 DIAGNOSIS — Z1231 Encounter for screening mammogram for malignant neoplasm of breast: Secondary | ICD-10-CM | POA: Diagnosis not present

## 2024-07-28 DIAGNOSIS — Z01419 Encounter for gynecological examination (general) (routine) without abnormal findings: Secondary | ICD-10-CM | POA: Diagnosis not present
